# Patient Record
Sex: Female | Born: 1985 | Race: White | Hispanic: Yes | Marital: Single | Smoking: Never smoker
Health system: Southern US, Community
[De-identification: ages and names within clinical notes are randomized; demographics above are authoritative.]

## PROBLEM LIST (undated history)

## (undated) ENCOUNTER — Inpatient Hospital Stay (HOSPITAL_COMMUNITY): Payer: Self-pay

## (undated) DIAGNOSIS — Z789 Other specified health status: Secondary | ICD-10-CM

## (undated) DIAGNOSIS — R87629 Unspecified abnormal cytological findings in specimens from vagina: Secondary | ICD-10-CM

## (undated) DIAGNOSIS — I10 Essential (primary) hypertension: Secondary | ICD-10-CM

## (undated) HISTORY — DX: Unspecified abnormal cytological findings in specimens from vagina: R87.629

## (undated) HISTORY — DX: Essential (primary) hypertension: I10

## (undated) HISTORY — DX: Other specified health status: Z78.9

---
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
## 2004-01-01 ENCOUNTER — Ambulatory Visit: Payer: Self-pay | Admitting: Family Medicine

## 2004-01-01 ENCOUNTER — Ambulatory Visit (HOSPITAL_COMMUNITY): Admission: RE | Admit: 2004-01-01 | Discharge: 2004-01-01 | Payer: Self-pay | Admitting: *Deleted

## 2004-01-16 ENCOUNTER — Ambulatory Visit: Payer: Self-pay | Admitting: Family Medicine

## 2004-01-21 ENCOUNTER — Ambulatory Visit: Payer: Self-pay | Admitting: Obstetrics and Gynecology

## 2004-01-24 ENCOUNTER — Ambulatory Visit (HOSPITAL_COMMUNITY): Admission: RE | Admit: 2004-01-24 | Discharge: 2004-01-24 | Payer: Self-pay | Admitting: *Deleted

## 2004-01-29 ENCOUNTER — Ambulatory Visit (HOSPITAL_COMMUNITY): Admission: RE | Admit: 2004-01-29 | Discharge: 2004-01-29 | Payer: Self-pay | Admitting: *Deleted

## 2004-03-30 ENCOUNTER — Ambulatory Visit: Payer: Self-pay | Admitting: *Deleted

## 2004-04-02 ENCOUNTER — Ambulatory Visit: Payer: Self-pay | Admitting: Family Medicine

## 2004-04-06 ENCOUNTER — Ambulatory Visit: Payer: Self-pay | Admitting: Obstetrics and Gynecology

## 2004-04-09 ENCOUNTER — Ambulatory Visit: Payer: Self-pay | Admitting: Obstetrics and Gynecology

## 2004-04-09 ENCOUNTER — Inpatient Hospital Stay (HOSPITAL_COMMUNITY): Admission: AD | Admit: 2004-04-09 | Discharge: 2004-04-13 | Payer: Self-pay | Admitting: Family Medicine

## 2004-04-11 ENCOUNTER — Encounter (INDEPENDENT_AMBULATORY_CARE_PROVIDER_SITE_OTHER): Payer: Self-pay | Admitting: *Deleted

## 2004-04-16 ENCOUNTER — Inpatient Hospital Stay (HOSPITAL_COMMUNITY): Admission: AD | Admit: 2004-04-16 | Discharge: 2004-04-16 | Payer: Self-pay | Admitting: Obstetrics & Gynecology

## 2004-04-21 ENCOUNTER — Inpatient Hospital Stay (HOSPITAL_COMMUNITY): Admission: AD | Admit: 2004-04-21 | Discharge: 2004-04-21 | Payer: Self-pay | Admitting: *Deleted

## 2004-04-29 ENCOUNTER — Emergency Department (HOSPITAL_COMMUNITY): Admission: EM | Admit: 2004-04-29 | Discharge: 2004-04-29 | Payer: Self-pay | Admitting: Emergency Medicine

## 2004-05-06 ENCOUNTER — Inpatient Hospital Stay (HOSPITAL_COMMUNITY): Admission: AD | Admit: 2004-05-06 | Discharge: 2004-05-06 | Payer: Self-pay | Admitting: Obstetrics and Gynecology

## 2006-09-26 ENCOUNTER — Emergency Department (HOSPITAL_COMMUNITY): Admission: EM | Admit: 2006-09-26 | Discharge: 2006-09-26 | Payer: Self-pay | Admitting: Family Medicine

## 2006-12-28 ENCOUNTER — Emergency Department (HOSPITAL_COMMUNITY): Admission: EM | Admit: 2006-12-28 | Discharge: 2006-12-28 | Payer: Self-pay | Admitting: Family Medicine

## 2007-07-18 ENCOUNTER — Emergency Department (HOSPITAL_COMMUNITY): Admission: EM | Admit: 2007-07-18 | Discharge: 2007-07-18 | Payer: Self-pay | Admitting: Emergency Medicine

## 2007-10-19 ENCOUNTER — Emergency Department (HOSPITAL_COMMUNITY): Admission: EM | Admit: 2007-10-19 | Discharge: 2007-10-19 | Payer: Self-pay | Admitting: Pediatrics

## 2010-06-26 NOTE — Op Note (Signed)
NAMEAIZAH, GEHLHAUSEN                ACCOUNT NO.:  [id number]   MEDICAL RECORD NO.:  [id number]          PATIENT TYPE:  WOC   LOCATION:  WOC                          FACILITY:  [facility]   PHYSICIAN:  Phil D. Okey Dupre, M.D.     DATE OF BIRTH:  July 16, 1985   DATE OF PROCEDURE:  04/11/2004  DATE OF DISCHARGE:                                 OPERATIVE REPORT   PREOPERATIVE DIAGNOSIS:  Post-date baby, unable to tolerate labor.   POSTOPERATIVE DIAGNOSIS:  Post-date baby, unable to tolerate labor.   PROCEDURE:  Low transverse cesarean section.   SURGEON:  Javier Glazier. Okey Dupre, M.D.   ASSISTANTPara March, M.D.   ANESTHESIA:  Spinal.   ESTIMATED BLOOD LOSS:  500 cc.   FINDINGS:  Female infant, 7 pounds 8 ounces, Apgars 9 and 9, cord pH 7.27.   SPECIMENS:  Placenta sent to pathology, hyperspiral cord.   DESCRIPTION OF PROCEDURE:  Under satisfactory spinal anesthesia with the  patient in the dorsal supine position and a Foley catheter in the urinary  bladder, the abdomen was prepped and draped in the usual sterile manner and  entered through a Pfannenstiel suprapubic incision situated 3 cm above the  symphysis pubis and extending for a total length of 16 cm.   The abdomen was entered by layers.  Upon entering the peritoneum cavity, the  visceral peritoneum and anterior surface of the uterus were opened  transversely by sharp dissection.  The bladder was pushed away from the  lower uterine segment which was entered by sharp and blunt dissection.  From  an LOT presentation, the baby was easily delivered.  The cord was doubly  clamped and divided.  The baby was handed to the pediatrician.  Specimen of  blood was taken from the cord for analysis.  The placenta spontaneously  removed.  The uterus was explored and closed with a continuous running  locked 0 Vicryl suture on an atraumatic needle.  The area was observed for  bleeding.  None was noted.  The fascia was closed with a continuous running  0 Vicryl on  an atraumatic needle.  Subcutaneous bleeders were controlled  with hot cautery.  Tape, instrument, sponge, and needle counts were reported  as correct before closure of the skin.  The skin edges were approximated  with skin staples.  A dry sterile dressing was applied.   The patient tolerated the procedure well and was transferred to the recovery  room with a Foley catheter draining clear amber urine at the end of the  procedure.  Total blood loss was approximately 500 cc.  Each layer was  irrigated with normal saline on exit.      PDR/MEDQ  D:  04/11/2004  T:  04/12/2004  Job:  [id number]

## 2010-06-26 NOTE — Discharge Summary (Signed)
NAMEJACIE, TRISTAN                ACCOUNT NO.:  [id number]   MEDICAL RECORD NO.:  [id number]          PATIENT TYPE:  INP   LOCATION:  9130                          [facility]ILITY:  [facility]   PHYSICIAN:  Elinor Dodge, M.D.        DATE OF BIRTH:  01-15-1986   DATE OF ADMISSION:  04/09/2004  DATE OF DISCHARGE:  04/13/2004                                 DISCHARGE SUMMARY   DISCHARGE DIAGNOSIS:  Cesarean delivery of a viable female infant.   PROCEDURES:  Low transverse c-section __________ labor.   DISCHARGE MEDICATIONS:  1.  Ibuprofen 600 mg one tablet p.o. q.6h. p.r.n. pain.  2.  Percocet 5/325 mg one-two tablets p.o. q.6h. p.r.n. minor-to-severe      pain.  3.  Prenatal vitamins one p.o. daily x6 weeks.   DISCHARGE INSTRUCTIONS:  The patient was told to return to the MAU in two-  three days for a staple removal.  Wound care per instructions in booklet.  Diet regular.  Activity, no heavy lifting for three weeks and nothing in the  vagina for six weeks.   FOLLOW UP:  At [facility] six weeks post partum, at which point she will  receive an IUD for contraception.   BRIEF ADMISSION HISTORY:  The patient is a 25 year old G1 who was admitted  at 41-4/7 weeks for induction of labor.  Her prenatal course was complicated  by early symmetric IUGR and a questionable marginal cord insertion.  She was  GBS negative and other prenatal labs were unremarkable.   HOSPITAL COURSE:  At the time of admission the patient was found to have a  cervical exam, fingertip, soft, 50% and -2 station.  She was started on  Cytotec x2 and subsequently Cervidil x1.  Pitocin was begun on hospital day  #2.  __________ occurred on hospital day #3.  Also on that day the baby was  deemed unable to tolerate labor, as the fetal heart rate would slow and  there was decreased variability with contractions.  After obtaining maternal  consent, the patient was taken to the OR for a c-section.   A low transverse c-section resulted  in the delivery of a viable female infant,  birth weight 7 pounds 8 ounces, Apgars 9 at one minute and 9 at five  minutes.  Cord pH was 7.27.  Estimated blood loss was around 500 mL.  This  was all done under spinal anesthesia.  Please see Dr. Okey Dupre dictated surgical  note for details.   The patient had a normal postoperative course with return of bowel function  and stable postoperative hemoglobin of 10.  Routine postoperative care was  given.  The patient decided on an IUD for contraception.  No circumcision  was done on the child.  The patient was discharged home on postoperative day  #2.  At the time of discharge she was afebrile with stable vitals and a  benign exam.      JM/MEDQ  D:  06/09/2004  T:  06/09/2004  Job:  [id number]   cc:   Women's Health  [facility]  Health Department

## 2010-11-05 LAB — POCT RAPID STREP A: Streptococcus, Group A Screen (Direct): NEGATIVE

## 2011-01-12 ENCOUNTER — Other Ambulatory Visit: Payer: Self-pay

## 2011-01-12 DIAGNOSIS — Z331 Pregnant state, incidental: Secondary | ICD-10-CM

## 2011-01-12 NOTE — Progress Notes (Signed)
Prenatal labs done today mholder

## 2011-01-13 LAB — OBSTETRIC PANEL
Antibody Screen: NEGATIVE
Basophils Absolute: 0 10*3/uL (ref 0.0–0.1)
Basophils Relative: 0 % (ref 0–1)
Eosinophils Absolute: 0.1 10*3/uL (ref 0.0–0.7)
Eosinophils Relative: 2 % (ref 0–5)
HCT: 39.1 % (ref 36.0–46.0)
Hemoglobin: 13.3 g/dL (ref 12.0–15.0)
Hepatitis B Surface Ag: NEGATIVE
Lymphocytes Relative: 21 % (ref 12–46)
Lymphs Abs: 1.8 10*3/uL (ref 0.7–4.0)
MCH: 31.1 pg (ref 26.0–34.0)
MCHC: 34 g/dL (ref 30.0–36.0)
MCV: 91.6 fL (ref 78.0–100.0)
Monocytes Absolute: 0.7 10*3/uL (ref 0.1–1.0)
Monocytes Relative: 9 % (ref 3–12)
Neutro Abs: 5.8 10*3/uL (ref 1.7–7.7)
Neutrophils Relative %: 68 % (ref 43–77)
Platelets: 308 10*3/uL (ref 150–400)
RBC: 4.27 MIL/uL (ref 3.87–5.11)
RDW: 13.2 % (ref 11.5–15.5)
Rh Type: POSITIVE
Rubella: >500 IU/mL — ABNORMAL HIGH
WBC: 8.4 10*3/uL (ref 4.0–10.5)

## 2011-01-13 LAB — SICKLE CELL SCREEN: Sickle Cell Screen: NEGATIVE

## 2011-01-13 LAB — HIV ANTIBODY (ROUTINE TESTING W REFLEX): HIV: NONREACTIVE

## 2011-01-14 LAB — CULTURE, OB URINE
Colony Count: NO GROWTH
Organism ID, Bacteria: NO GROWTH

## 2011-01-19 ENCOUNTER — Other Ambulatory Visit (HOSPITAL_COMMUNITY)
Admission: RE | Admit: 2011-01-19 | Discharge: 2011-01-19 | Disposition: A | Discharge status: Home / Self Care | Payer: Self-pay | Source: Ambulatory Visit | Attending: Family Medicine | Admitting: Family Medicine

## 2011-01-19 ENCOUNTER — Ambulatory Visit (INDEPENDENT_AMBULATORY_CARE_PROVIDER_SITE_OTHER): Payer: Self-pay | Admitting: Family Medicine

## 2011-01-19 ENCOUNTER — Encounter: Payer: Self-pay | Admitting: Family Medicine

## 2011-01-19 DIAGNOSIS — Z3480 Encounter for supervision of other normal pregnancy, unspecified trimester: Secondary | ICD-10-CM

## 2011-01-19 DIAGNOSIS — Z331 Pregnant state, incidental: Secondary | ICD-10-CM

## 2011-01-19 DIAGNOSIS — Z3490 Encounter for supervision of normal pregnancy, unspecified, unspecified trimester: Secondary | ICD-10-CM | POA: Insufficient documentation

## 2011-01-19 DIAGNOSIS — Z01419 Encounter for gynecological examination (general) (routine) without abnormal findings: Secondary | ICD-10-CM | POA: Insufficient documentation

## 2011-01-19 NOTE — Patient Instructions (Signed)
It was very nice to meet you today Congratulations on your pregnancy I would like to schedule you for an ultrasound to confirm the dates of your pregnancy, please call Rudell Cobb to arrange to get the orange card as soon as possible.  Please call us when you have this.   I would also like to go ahead and screen you for diabetes since your father has diabetes. Please remind your husband not to smoke within the house as it is bad for everyones health.  Now would be a good time for him to try and quit before baby gets here.  He can call [phone] for the [facility] quitline for help with this.  They have people who speak english and spanish I will see you back in 4 weeks and we will draw your quad screen to screen for genetic disorders, we will also schedule another ultrasound to look at baby's anatomy. Remember if you have bleeding, painful cramping that does not go away, or leaking of fluid please go to [facility] or give our office a call.

## 2011-01-20 LAB — GC/CHLAMYDIA PROBE AMP, GENITAL
Chlamydia, DNA Probe: NEGATIVE
GC Probe Amp, Genital: NEGATIVE

## 2011-01-21 ENCOUNTER — Other Ambulatory Visit: Payer: Self-pay

## 2011-01-21 ENCOUNTER — Other Ambulatory Visit: Payer: Self-pay | Admitting: Family Medicine

## 2011-01-21 LAB — GLUCOSE, CAPILLARY: Glucose-Capillary: 113 mg/dL — ABNORMAL HIGH (ref 70–99)

## 2011-01-21 NOTE — Progress Notes (Signed)
1 HR GLUCOSE = 113 MG/DL BAJORDAN, MLS

## 2011-01-25 ENCOUNTER — Encounter: Payer: Self-pay | Admitting: Family Medicine

## 2011-01-25 NOTE — Progress Notes (Signed)
Subjective:    Rebecca Serrano is a G2P1001 [redacted]w[redacted]d being seen today for her first obstetrical visit.  Pregnancy history fully reviewed.  Unsure of LMP was on OCP, but not taking regularly  Patient reports no bleeding, no contractions, no leaking and mild occasional cramping.  Filed Vitals:   01/19/11 0845  BP: 119/78  Weight: 164 lb (74.39 kg)    HISTORY: OB History    Grav Para Term Preterm Abortions TAB SAB Ect Mult Living   2 1 1  0 0 0 0 0 0 1     # Outc Date GA Lbr Len/2nd Wgt Sex Del Anes PTL Lv   1 TRM 3/06 [redacted]w[redacted]d  7lb8oz(3.402kg) M LTCS Spinal No Yes   2 CUR              Past Medical History  Diagnosis Date  . No pertinent past medical history    Past Surgical History  Procedure Date  . Cesarean section    Family History  Problem Relation Age of Onset  . Diabetes Father   . Drug abuse Paternal Aunt   . Drug abuse Paternal Uncle   . Early death Paternal Grandfather      Exam    Pelvic Exam:    Perineum: Normal Perineum   Vulva: normal   Vagina:  normal mucosa, normal discharge   Cervix: no cervical motion tenderness and no lesions   Adnexa: normal adnexa   Skin: normal coloration and turgor, no rashes    Neurologic: normal   Extremities: normal strength, tone, and muscle mass   HEENT sclera clear, anicteric   Mouth/Teeth mucous membranes moist, pharynx normal without lesions   Neck supple and no masses   Cardiovascular: regular rate and rhythm, no murmurs or gallops   Respiratory:  appears well, vitals normal, no respiratory distress, acyanotic, normal RR, ear and throat exam is normal, neck free of mass or lymphadenopathy, chest clear, no wheezing, crepitations, rhonchi, normal symmetric air entry   Abdomen: soft, non-tender; bowel sounds normal; no masses,  no organomegaly      Assessment:    Pregnancy: G2P1001 Patient Active Problem List  Diagnoses  . Normal pregnancy   PHQ 9 with score of 4      Plan:     Initial labs drawn. Prior  C/S, undecided if she wants to VBAC at this point.   1st degree relative diabetic, early glucola ordered Flu shot given Prenatal vitamins. Problem list reviewed and updated. Genetic Screening discussed Quad Screen: Wants to have done  Ultrasound discussed; fetal survey: Ordered, unsure of dates, will schedule once she has orange card, can not afford [place] with Dr.Marshall.  Follow up in 4 weeks.      Rebecca Serrano 01/25/2011 [facility] Pregnancy Home Risk Screening Form  1. Thinking back to just before you got pregnant how did you feel about becoming pregnant? []   I wanted to be pregnant sooner. []   I wanted to be pregnant now. [x]   I wanted to be pregnant later. []   I did not want to be pregnant then or any time in the future []   I don't know  2.  Within the last year, have you been hit, slapped, kicked or otherwise physically hurt by someone?  No  3.  Are you in a relationship with a person who threatens or physically hurts you? No  4.  Has anyone forced you to have sexual activities that made you feel uncomfortable? No  5.  In the last  12 months were you ever hungry but didn't east because you couldn't afford enough food?  No  6.  Do you have a safe and stable place to live?  Yes  7.  Which statement best describes your smoking status?   [x]   I have never smoked, or have smoked less than 100 cigarettes in my lifetime []   I stopped smoking BEFORE I found out I was pregnant and am not smoking now []   I stopped smoking AFTER I found out I was pregnant and am not smoking now []   I smoke now but have cut down some since I found out I was pregnant []   I smoke about the same amount now as I did before I found out I was pregnant  8.  Did any of your parents have a problem with alcohol or other drug use? No  9.  Do any of your friends have a problem with alcohol or other drug use? No  10.  Does your partner have a problem with alcohol or other drug use? Yes, cigarettes  11.  In the  past, have you had difficulties in your life due to alcohol or other drugs, including prescription medications? No  12.  Before you knew you were pregnant, how often did you drink any alcohol, including beer or wine, or use other drugs?  [x]   Not at all []   Rarely []   Sometimes []   Frequently  13.  In the past month, how often did you drink any alcohol, including beer or wine, or use another drug? [x]   Not at all []   Rarely []   Sometimes []   Frequently

## 2011-01-26 ENCOUNTER — Telehealth: Payer: Self-pay | Admitting: Family Medicine

## 2011-01-26 NOTE — Telephone Encounter (Signed)
Patient brought by her orange card and I scanned it into her chart.  She would like for you to call her with an ultrasound appointment.

## 2011-01-27 ENCOUNTER — Telehealth: Payer: Self-pay | Admitting: Family Medicine

## 2011-01-27 ENCOUNTER — Encounter: Payer: Self-pay | Admitting: Family Medicine

## 2011-01-27 ENCOUNTER — Other Ambulatory Visit: Payer: Self-pay | Admitting: Family Medicine

## 2011-01-27 NOTE — Telephone Encounter (Signed)
done

## 2011-01-27 NOTE — Telephone Encounter (Signed)
appt is sched for 12/27 at 9:15 AM. Note is sent to pt as I have been unable to reach pt by phone, numbers incorrect,etc.Rebecca Serrano [name]

## 2011-01-28 ENCOUNTER — Encounter: Payer: Self-pay | Admitting: Family Medicine

## 2011-01-28 NOTE — Progress Notes (Signed)
Note reviewed.  Agree with plan of care by Dr. Ashley Royalty. Unsure dates- needs ultrasound asap.  Hopefully will qualify for orange card soon. +FH diabetes - check early glucola and repeat at 26-28 weeks. H/o section - will need consult with OB at 30-32 weeks to discuss delivery planning.

## 2011-02-04 ENCOUNTER — Ambulatory Visit (HOSPITAL_COMMUNITY)
Admission: RE | Admit: 2011-02-04 | Discharge: 2011-02-04 | Disposition: A | Discharge status: Home / Self Care | Payer: Self-pay | Source: Ambulatory Visit | Attending: Family Medicine | Admitting: Family Medicine

## 2011-02-04 DIAGNOSIS — Z3689 Encounter for other specified antenatal screening: Secondary | ICD-10-CM | POA: Insufficient documentation

## 2011-02-09 NOTE — L&D Delivery Note (Addendum)
Delivery Note At  a viable female was delivered via NSVD  (Presentation: OA ; at 1713 ).  APGAR: 8,9 ; weight 8lbs .   Placenta status: Delivered intact at  1724, .  Cord: 3 vessel  with the following complications: none .  Cord pH: n/a  Anesthesia:  Epidural Episiotomy: n/a Lacerations: 1st degree R labial  Suture Repair: 3.0 vicryl Est. Blood Loss (mL): 400 mL  Mom to postpartum.  Baby to nursery-stable.  Jasim Harari 08/10/2011, 5:43 PM

## 2011-02-17 ENCOUNTER — Ambulatory Visit (INDEPENDENT_AMBULATORY_CARE_PROVIDER_SITE_OTHER): Payer: Self-pay | Admitting: Family Medicine

## 2011-02-17 ENCOUNTER — Encounter: Payer: Self-pay | Admitting: Family Medicine

## 2011-02-17 DIAGNOSIS — Z331 Pregnant state, incidental: Secondary | ICD-10-CM

## 2011-02-17 NOTE — Patient Instructions (Addendum)
It was good seeing you today.  We will schedule an [place] for you in the next 4 weeks I would like for you to make an appt. With our [facility] clinic here for your next visit in 4 weeks You may start to feel baby move in the next few weeks Remember if you have bleeding, cramping, leaking of fluid go to [facility] or call our clinic

## 2011-02-17 NOTE — Progress Notes (Signed)
Here for f/u OB visit.  Had early Korea due to being unsure of LMP. [place] dates consistent with LMP, giving her GA of [redacted]w[redacted]d No concerns today.  Denies cramping, bleeding, leaking of fluid.  No nausea. Early glucola 113 Hx of prior C/S FHR as noted in vitals, BP ok.  Assessment and Plan: Normal pregnancy  -Anatomy US ordered  -Quad screen drawn today  -Cont PNV  -Recheck glucola around 24 weeks  -Schedule next OB appt with OB clinic  -[facility] VB[facility] around 34 weeks

## 2011-03-11 ENCOUNTER — Ambulatory Visit (INDEPENDENT_AMBULATORY_CARE_PROVIDER_SITE_OTHER): Payer: Self-pay | Admitting: Family Medicine

## 2011-03-11 DIAGNOSIS — Z3480 Encounter for supervision of other normal pregnancy, unspecified trimester: Secondary | ICD-10-CM

## 2011-03-11 NOTE — Progress Notes (Signed)
25yo G2P1 @ 19 wks w/o concerns today. Would like to start exercising. Takes prenatal vitamin 4/7 days. No fetal movement yet, but pt denied feeling any w/ prior pregnancy. Denies vaginal discharge/pain, contractions, RUQ pain, change in vision. H/o c-section but would like to try VBAC. Pt scheduled for anatomy scan next week.   A/P 25yo G2P1 at 19wks and doing well. Pt to continue w/ daily prenatal vitamin. Kick counts and fetal movement reviewed w/ pt. Pt to return in 4 wks. Will refer pt to OB for delivery plan at approximately 30wks. Handouts provided on Kick count, second trimester pregnancy, and exercise during pregnancy. She received flu vaccine today.

## 2011-03-11 NOTE — Patient Instructions (Addendum)
Thank you for coming in to clinic today. Your pregnancy is going very well. As the pregnancy continues you should start to feel the baby moving. Please continue to take your prenatal vitamin every day. I've included some information below on exercise during pregnancy. Exercise is great for you and the baby so long as you stay well hydrated, avoid high impact, and start w/ short distances and go slow. Please come back to clinic in 4 weeks.     Fetal Movement Counts Patient Name: __________________________________________________ Patient Due Date: ____________________ Rebecca Serrano counts is highly recommended in high risk pregnancies, but it is a good idea for every pregnant woman to do. Start counting fetal movements at 28 weeks of the pregnancy. Fetal movements increase after eating a full meal or eating or drinking something sweet (the blood sugar is higher). It is also important to drink plenty of fluids (well hydrated) before doing the count. Lie on your left side because it helps with the circulation or you can sit in a comfortable chair with your arms over your belly (abdomen) with no distractions around you. DOING THE COUNT  Try to do the count the same time of day each time you do it.     Mark the day and time, then see how long it takes for you to feel 10 movements (kicks, flutters, swishes, rolls). You should have at least 10 movements within 2 hours. You will most likely feel 10 movements in much less than 2 hours. If you do not, wait an hour and count again. After a couple of days you will see a pattern.     What you are looking for is a change in the pattern or not enough counts in 2 hours. Is it taking longer in time to reach 10 movements?  SEEK MEDICAL CARE IF:  You feel less than 10 counts in 2 hours. Tried twice.     No movement in one hour.     The pattern is changing or taking longer each day to reach 10 counts in 2 hours.     You feel the baby is not moving as it usually does.    Date: ____________ Movements: ____________ Start time: ____________ Rebecca Serrano time: ____________   Date: ____________ Movements: ____________ Start time: ____________ Rebecca Serrano time: ____________ Date: ____________ Movements: ____________ Start time: ____________ Rebecca Serrano time: ____________ Date: ____________ Movements: ____________ Start time: ____________ Rebecca Serrano time: ____________ Date: ____________ Movements: ____________ Start time: ____________ Rebecca Serrano time: ____________ Date: ____________ Movements: ____________ Start time: ____________ Rebecca Serrano time: ____________ Date: ____________ Movements: ____________ Start time: ____________ Rebecca Serrano time: ____________ Date: ____________ Movements: ____________ Start time: ____________ Rebecca Serrano time: ____________   Date: ____________ Movements: ____________ Start time: ____________ Rebecca Serrano time: ____________ Date: ____________ Movements: ____________ Start time: ____________ Rebecca Serrano time: ____________ Date: ____________ Movements: ____________ Start time: ____________ Rebecca Serrano time: ____________ Date: ____________ Movements: ____________ Start time: ____________ Rebecca Serrano time: ____________ Date: ____________ Movements: ____________ Start time: ____________ Rebecca Serrano time: ____________ Date: ____________ Movements: ____________ Start time: ____________ Rebecca Serrano time: ____________ Date: ____________ Movements: ____________ Start time: ____________ Rebecca Serrano time: ____________   Date: ____________ Movements: ____________ Start time: ____________ Rebecca Serrano time: ____________ Date: ____________ Movements: ____________ Start time: ____________ Rebecca Serrano time: ____________ Date: ____________ Movements: ____________ Start time: ____________ Rebecca Serrano time: ____________ Date: ____________ Movements: ____________ Start time: ____________ Rebecca Serrano time: ____________ Date: ____________ Movements: ____________ Start time: ____________ Rebecca Serrano time: ____________ Date: ____________ Movements:  ____________ Start time: ____________ Rebecca Serrano time: ____________ Date: ____________ Movements: ____________ Start time: ____________ Rebecca Serrano time: ____________  Date: ____________ Movements: ____________ Start time: ____________ Rebecca Serrano time: ____________ Date: ____________ Movements: ____________ Start time: ____________ Rebecca Serrano time: ____________ Date: ____________ Movements: ____________ Start time: ____________ Rebecca Serrano time: ____________ Date: ____________ Movements: ____________ Start time: ____________ Rebecca Serrano time: ____________ Date: ____________ Movements: ____________ Start time: ____________ Rebecca Serrano time: ____________ Date: ____________ Movements: ____________ Start time: ____________ Rebecca Serrano time: ____________ Date: ____________ Movements: ____________ Start time: ____________ Rebecca Serrano time: ____________   Date: ____________ Movements: ____________ Start time: ____________ Rebecca Serrano time: ____________ Date: ____________ Movements: ____________ Start time: ____________ Rebecca Serrano time: ____________ Date: ____________ Movements: ____________ Start time: ____________ Rebecca Serrano time: ____________ Date: ____________ Movements: ____________ Start time: ____________ Rebecca Serrano time: ____________ Date: ____________ Movements: ____________ Start time: ____________ Rebecca Serrano time: ____________ Date: ____________ Movements: ____________ Start time: ____________ Rebecca Serrano time: ____________ Date: ____________ Movements: ____________ Start time: ____________ Rebecca Serrano time: ____________   Date: ____________ Movements: ____________ Start time: ____________ Rebecca Serrano time: ____________ Date: ____________ Movements: ____________ Start time: ____________ Rebecca Serrano time: ____________ Date: ____________ Movements: ____________ Start time: ____________ Rebecca Serrano time: ____________ Date: ____________ Movements: ____________ Start time: ____________ Rebecca Serrano time: ____________ Date: ____________ Movements: ____________ Start time: ____________  Rebecca Serrano time: ____________ Date: ____________ Movements: ____________ Start time: ____________ Rebecca Serrano time: ____________ Date: ____________ Movements: ____________ Start time: ____________ Rebecca Serrano time: ____________   Date: ____________ Movements: ____________ Start time: ____________ Rebecca Serrano time: ____________ Date: ____________ Movements: ____________ Start time: ____________ Rebecca Serrano time: ____________ Date: ____________ Movements: ____________ Start time: ____________ Rebecca Serrano time: ____________ Date: ____________ Movements: ____________ Start time: ____________ Rebecca Serrano time: ____________ Date: ____________ Movements: ____________ Start time: ____________ Rebecca Serrano time: ____________ Date: ____________ Movements: ____________ Start time: ____________ Rebecca Serrano time: ____________ Date: ____________ Movements: ____________ Start time: ____________ Rebecca Serrano time: ____________   Date: ____________ Movements: ____________ Start time: ____________ Rebecca Serrano time: ____________ Date: ____________ Movements: ____________ Start time: ____________ Rebecca Serrano time: ____________ Date: ____________ Movements: ____________ Start time: ____________ Rebecca Serrano time: ____________ Date: ____________ Movements: ____________ Start time: ____________ Rebecca Serrano time: ____________ Date: ____________ Movements: ____________ Start time: ____________ Rebecca Serrano time: ____________ Date: ____________ Movements: ____________ Start time: ____________ Rebecca Serrano time: ____________ Document Released: 02/24/2006 Document Revised: 10/07/2010 Document Reviewed: 08/27/2008 [employer] Patient Information 2012 [employer]   Pregnancy - Second Trimester The second trimester of pregnancy (3 to 6 months) is a period of rapid growth for you and your baby. At the end of the sixth month, your baby is about 9 inches long and weighs 1 1/2 pounds. You will begin to feel the baby move between 18 and 20 weeks of the pregnancy. This is called quickening. Weight gain is  faster. A clear fluid (colostrum) may leak out of your breasts. You may feel small contractions of the womb (uterus). This is known as false labor or Braxton-Hicks contractions. This is like a practice for labor when the baby is ready to be born. Usually, the problems with morning sickness have usually passed by the end of your first trimester. Some women develop small dark blotches (called cholasma, mask of pregnancy) on their face that usually goes away after the baby is born. Exposure to the sun makes the blotches worse. Acne may also develop in some pregnant women and pregnant women who have acne, may find that it goes away. PRENATAL EXAMS  Blood work may continue to be done during prenatal exams. These tests are done to check on your health and the probable health of your baby. Blood work is used to follow your blood levels (hemoglobin). Anemia (low hemoglobin) is common during pregnancy. Iron and vitamins are given to help prevent this. You  will also be checked for diabetes between 24 and 28 weeks of the pregnancy. Some of the previous blood tests may be repeated.     The size of the uterus is measured during each visit. This is to make sure that the baby is continuing to grow properly according to the dates of the pregnancy.     Your blood pressure is checked every prenatal visit. This is to make sure you are not getting toxemia.     Your urine is checked to make sure you do not have an infection, diabetes or protein in the urine.     Your weight is checked often to make sure gains are happening at the suggested rate. This is to ensure that both you and your baby are growing normally.     Sometimes, an ultrasound is performed to confirm the proper growth and development of the baby. This is a test which bounces harmless sound waves off the baby so your caregiver can more accurately determine due dates.  Sometimes, a specialized test is done on the amniotic fluid surrounding the baby. This test  is called an amniocentesis. The amniotic fluid is obtained by sticking a needle into the belly (abdomen). This is done to check the chromosomes in instances where there is a concern about possible genetic problems with the baby. It is also sometimes done near the end of pregnancy if an early delivery is required. In this case, it is done to help make sure the baby's lungs are mature enough for the baby to live outside of the womb. CHANGES OCCURING IN THE SECOND TRIMESTER OF PREGNANCY Your body goes through many changes during pregnancy. They vary from person to person. Talk to your caregiver about changes you notice that you are concerned about.  During the second trimester, you will likely have an increase in your appetite. It is normal to have cravings for certain foods. This varies from person to person and pregnancy to pregnancy.     Your lower abdomen will begin to bulge.     You may have to urinate more often because the uterus and baby are pressing on your bladder. It is also common to get more bladder infections during pregnancy (pain with urination). You can help this by drinking lots of fluids and emptying your bladder before and after intercourse.     You may begin to get stretch marks on your hips, abdomen, and breasts. These are normal changes in the body during pregnancy. There are no exercises or medications to take that prevent this change.     You may begin to develop swollen and bulging veins (varicose veins) in your legs. Wearing support hose, elevating your feet for 15 minutes, 3 to 4 times a day and limiting salt in your diet helps lessen the problem.     Heartburn may develop as the uterus grows and pushes up against the stomach. Antacids recommended by your caregiver helps with this problem. Also, eating smaller meals 4 to 5 times a day helps.     Constipation can be treated with a stool softener or adding bulk to your diet. Drinking lots of fluids, vegetables, fruits, and  whole grains are helpful.     Exercising is also helpful. If you have been very active up until your pregnancy, most of these activities can be continued during your pregnancy. If you have been less active, it is helpful to start an exercise program such as walking.     Hemorrhoids (varicose  veins in the rectum) may develop at the end of the second trimester. Warm sitz baths and hemorrhoid cream recommended by your caregiver helps hemorrhoid problems.     Backaches may develop during this time of your pregnancy. Avoid heavy lifting, wear low heal shoes and practice good posture to help with backache problems.     Some pregnant women develop tingling and numbness of their hand and fingers because of swelling and tightening of ligaments in the wrist (carpel tunnel syndrome). This goes away after the baby is born.     As your breasts enlarge, you may have to get a bigger bra. Get a comfortable, cotton, support bra. Do not get a nursing bra until the last month of the pregnancy if you will be nursing the baby.     You may get a dark line from your belly button to the pubic area called the linea nigra.     You may develop rosy cheeks because of increase blood flow to the face.     You may develop spider looking lines of the face, neck, arms and chest. These go away after the baby is born.  HOME CARE INSTRUCTIONS    It is extremely important to avoid all smoking, herbs, alcohol, and unprescribed drugs during your pregnancy. These chemicals affect the formation and growth of the baby. Avoid these chemicals throughout the pregnancy to ensure the delivery of a healthy infant.     Most of your home care instructions are the same as suggested for the first trimester of your pregnancy. Keep your caregiver's appointments. Follow your caregiver's instructions regarding medication use, exercise and diet.     During pregnancy, you are providing food for you and your baby. Continue to eat regular,  well-balanced meals. Choose foods such as meat, fish, milk and other low fat dairy products, vegetables, fruits, and whole-grain breads and cereals. Your caregiver will tell you of the ideal weight gain.     A physical sexual relationship may be continued up until near the end of pregnancy if there are no other problems. Problems could include early (premature) leaking of amniotic fluid from the membranes, vaginal bleeding, abdominal pain, or other medical or pregnancy problems.     Exercise regularly if there are no restrictions. Check with your caregiver if you are unsure of the safety of some of your exercises. The greatest weight gain will occur in the last 2 trimesters of pregnancy. Exercise will help you:     Control your weight.     Get you in shape for labor and delivery.     Lose weight after you have the baby.     Wear a good support or jogging bra for breast tenderness during pregnancy. This may help if worn during sleep. Pads or tissues may be used in the bra if you are leaking colostrum.     Do not use hot tubs, steam rooms or saunas throughout the pregnancy.     Wear your seat belt at all times when driving. This protects you and your baby if you are in an accident.     Avoid raw meat, uncooked cheese, cat litter boxes and soil used by cats. These carry germs that can cause birth defects in the baby.     The second trimester is also a good time to visit your dentist for your dental health if this has not been done yet. Getting your teeth cleaned is OK. Use a soft toothbrush. Brush gently during pregnancy.  It is easier to loose urine during pregnancy. Tightening up and strengthening the pelvic muscles will help with this problem. Practice stopping your urination while you are going to the bathroom. These are the same muscles you need to strengthen. It is also the muscles you would use as if you were trying to stop from passing gas. You can practice tightening these muscles up  10 times a set and repeating this about 3 times per day. Once you know what muscles to tighten up, do not perform these exercises during urination. It is more likely to contribute to an infection by backing up the urine.     Ask for help if you have financial, counseling or nutritional needs during pregnancy. Your caregiver will be able to offer counseling for these needs as well as refer you for other special needs.     Your skin may become oily. If so, wash your face with mild soap, use non-greasy moisturizer and oil or cream based makeup.  MEDICATIONS AND DRUG USE IN PREGNANCY  Take prenatal vitamins as directed. The vitamin should contain 1 milligram of folic acid. Keep all vitamins out of reach of children. Only a couple vitamins or tablets containing iron may be fatal to a baby or young child when ingested.     Avoid use of all medications, including herbs, over-the-counter medications, not prescribed or suggested by your caregiver. Only take over-the-counter or prescription medicines for pain, discomfort, or fever as directed by your caregiver. Do not use aspirin.     Let your caregiver also know about herbs you may be using.     Alcohol is related to a number of birth defects. This includes fetal alcohol syndrome. All alcohol, in any form, should be avoided completely. Smoking will cause low birth rate and premature babies.     Street or illegal drugs are very harmful to the baby. They are absolutely forbidden. A baby born to an addicted mother will be addicted at birth. The baby will go through the same withdrawal an adult does.  SEEK MEDICAL CARE IF:   You have any concerns or worries during your pregnancy. It is better to call with your questions if you feel they cannot wait, rather than worry about them. SEEK IMMEDIATE MEDICAL CARE IF:    An unexplained oral temperature above 102 F (38.9 C) develops, or as your caregiver suggests.     You have leaking of fluid from the vagina  (birth canal). If leaking membranes are suspected, take your temperature and tell your caregiver of this when you call.     There is vaginal spotting, bleeding, or passing clots. Tell your caregiver of the amount and how many pads are used. Light spotting in pregnancy is common, especially following intercourse.     You develop a bad smelling vaginal discharge with a change in the color from clear to white.     You continue to feel sick to your stomach (nauseated) and have no relief from remedies suggested. You vomit blood or coffee ground-like materials.     You lose more than 2 pounds of weight or gain more than 2 pounds of weight over 1 week, or as suggested by your caregiver.     You notice swelling of your face, hands, feet, or legs.     You get exposed to [place] measles and have never had them.     You are exposed to fifth disease or chickenpox.     You develop belly (abdominal)  pain. Round ligament discomfort is a common non-cancerous (benign) cause of abdominal pain in pregnancy. Your caregiver still must evaluate you.     You develop a bad headache that does not go away.     You develop fever, diarrhea, pain with urination, or shortness of breath.     You develop visual problems, blurry, or double vision.     You fall or are in a car accident or any kind of trauma.     There is mental or physical violence at home.  Document Released: 01/19/2001 Document Revised: 10/07/2010 Document Reviewed: 07/24/2008 [facility] Patient Information 2012 [place], [place].   Exercise During Pregnancy It is possible to maintain a healthy exercise program throughout a pregnancy. However, it is important to discuss exercising with your caregiver, so that the two of you may develop an appropriate exercise program. It is important to remember that exercise during pregnancy should be use to maintain one's health and not to lose weight. Strenuous activities should be avoided as the may cause the baby to  have difficulty obtaining proper amounts of oxygen. A proper pregnancy exercise plan has many benefits including preparing you for the physical challenges of childbirth by strengthening the muscles that help with childbirth, reducing common backaches, alleviating constipation, improving posture, elevating mood, and promoting better sleep. If possible, begin exercising regularly before you become pregnant and continue through the duration of the pregnancy. It is difficult for a woman to begin an exercise program later in a pregnancy due to enlargement of the uterus and breasts and a shift in the center of gravity. Pregnancy exercise programs should be aimed at improving the muscles of the heart, back, pelvis, and abdomen. GENERAL GUIDELINES   Every woman and pregnancy is different; thus, the level of exercise you can do depends on your health, the conditions of the pregnancy, and activity level before pregnancy. For women who were sedentary before pregnancy, walking is a good way to begin. Use caution while participation in sports during pregnancy, because your center of gravity changes and may affect your balance or that require rapid movements. Always make sure to drink plenty of fluids to avoid dehydration, which may decrease blood flow to your baby. Avoid any activity that has the potential for trauma to the abdomen. If possible try to avoid high-altitude activities, which can deprive you and your baby of oxygen; this may cause premature labor. Talk with your caregiver.   Performing a proper warm up and cool down are very important. It is important to start slowly and build up to more demanding exercises. Toward the end of an exercise session, gradually slow your activity. Perhaps try and work back through the exercises in reverse order. Check your pulse during peak activity, and discuss with your caregiver an appropriate range of heart rate for activity. Slow down your activity if your heart starts beating  faster than the target range recommended by your health care provider. Do not exceed a heart rate of 140 beats per minute. Exercise that is too strenuous may speed up the baby's heartbeat to a dangerous level. In general, if you are able to carry on a conversation comfortably while exercising, your heart rate is probably within the recommended limits. Check to make sure.   You should stop exercising and call your health care provider if you have any unusual symptoms, such as pain, uterine contractions, chest pain, bleeding or fluid leakage from the vagina, dizziness, or shortness of breath. Talk to your health caregiver if  you have any questions.   Document Released: 01/25/2005 Document Revised: 10/07/2010 Document Reviewed: 05/09/2008 [facility] Patient Information 2012 [place], [place].

## 2011-03-17 ENCOUNTER — Ambulatory Visit (HOSPITAL_COMMUNITY)
Admission: RE | Admit: 2011-03-17 | Discharge: 2011-03-17 | Disposition: A | Discharge status: Home / Self Care | Payer: Self-pay | Source: Ambulatory Visit | Attending: Family Medicine | Admitting: Family Medicine

## 2011-03-17 DIAGNOSIS — O358XX0 Maternal care for other (suspected) fetal abnormality and damage, not applicable or unspecified: Secondary | ICD-10-CM | POA: Insufficient documentation

## 2011-03-17 DIAGNOSIS — Z1389 Encounter for screening for other disorder: Secondary | ICD-10-CM | POA: Insufficient documentation

## 2011-03-17 DIAGNOSIS — Z363 Encounter for antenatal screening for malformations: Secondary | ICD-10-CM | POA: Insufficient documentation

## 2011-03-17 DIAGNOSIS — O34219 Maternal care for unspecified type scar from previous cesarean delivery: Secondary | ICD-10-CM | POA: Insufficient documentation

## 2011-04-09 ENCOUNTER — Ambulatory Visit (INDEPENDENT_AMBULATORY_CARE_PROVIDER_SITE_OTHER): Payer: Self-pay | Admitting: Family Medicine

## 2011-04-09 VITALS — BP 102/62 | Wt 174.8 lb

## 2011-04-09 DIAGNOSIS — Z3490 Encounter for supervision of normal pregnancy, unspecified, unspecified trimester: Secondary | ICD-10-CM

## 2011-04-09 DIAGNOSIS — Z3480 Encounter for supervision of other normal pregnancy, unspecified trimester: Secondary | ICD-10-CM

## 2011-04-09 NOTE — Patient Instructions (Signed)
Thank you for coming in today, it was good to see you I would like for you to come in for a lab visit next week to have your glucola (test for diabetes) done.   I will see you back in 4 weeks, we will check some additional labwork at that time. Now that you are feeling baby move well, try to do kick counts when you are resting.  Please call with any questions, and remember if you have bleeding, leaking of fluid, cramping/contractions please go to [facility]

## 2011-04-14 ENCOUNTER — Other Ambulatory Visit (INDEPENDENT_AMBULATORY_CARE_PROVIDER_SITE_OTHER): Payer: Self-pay | Admitting: Family Medicine

## 2011-04-14 ENCOUNTER — Other Ambulatory Visit: Payer: Self-pay

## 2011-04-14 DIAGNOSIS — Z3480 Encounter for supervision of other normal pregnancy, unspecified trimester: Secondary | ICD-10-CM

## 2011-04-14 DIAGNOSIS — Z331 Pregnant state, incidental: Secondary | ICD-10-CM

## 2011-04-14 LAB — GLUCOSE, POCT (MANUAL RESULT ENTRY): POC Glucose: 132

## 2011-04-14 NOTE — Progress Notes (Signed)
1 Hr OB glucose = 132 mg/dL BAJORDAN, MLS

## 2011-04-14 NOTE — Progress Notes (Signed)
1 hr gtt done today Rebecca Serrano 

## 2011-04-20 NOTE — Progress Notes (Signed)
Here for f/u OB visit.  Had early [place] due to being unsure of LMP. [place] dates consistent with LMP, giving her GA of [redacted]w[redacted]d No concerns today.  Denies cramping, bleeding, leaking of fluid.  No nausea. Early glucola 113 Hx of prior C/S, would like to VBAC FHR as noted in vitals, BP ok.  Anatomy US: Female, no abnormalities seen  Assessment and Plan: Normal pregnancy  -Cont PNV  -Recheck glucola  -[facility] around 30 weeks  -Kick count reviewed  -Return in 4 weeks

## 2011-05-05 ENCOUNTER — Encounter: Payer: Self-pay | Admitting: Family Medicine

## 2011-05-06 ENCOUNTER — Ambulatory Visit (INDEPENDENT_AMBULATORY_CARE_PROVIDER_SITE_OTHER): Payer: Self-pay | Admitting: Family Medicine

## 2011-05-06 VITALS — BP 104/72 | Temp 98.0°F | Wt 182.6 lb

## 2011-05-06 DIAGNOSIS — Z3490 Encounter for supervision of normal pregnancy, unspecified, unspecified trimester: Secondary | ICD-10-CM

## 2011-05-06 DIAGNOSIS — Z3480 Encounter for supervision of other normal pregnancy, unspecified trimester: Secondary | ICD-10-CM

## 2011-05-06 LAB — CBC
HCT: 37.8 % (ref 36.0–46.0)
Hemoglobin: 12.1 g/dL (ref 12.0–15.0)
MCH: 30.6 pg (ref 26.0–34.0)
MCHC: 32 g/dL (ref 30.0–36.0)
MCV: 95.7 fL (ref 78.0–100.0)
Platelets: 296 10*3/uL (ref 150–400)
RBC: 3.95 MIL/uL (ref 3.87–5.11)
RDW: 13.8 % (ref 11.5–15.5)
WBC: 12 10*3/uL — ABNORMAL HIGH (ref 4.0–10.5)

## 2011-05-06 LAB — HIV ANTIBODY (ROUTINE TESTING W REFLEX): HIV: NONREACTIVE

## 2011-05-06 LAB — RPR

## 2011-05-06 NOTE — Patient Instructions (Addendum)
Thank you for coming in today, it was good to see you For the area between your breasts you can use a medicine called clotrimazole, you can purchase this over the counter. I would like for you to schedule an return appointment for two weeks from now with our OB clinic. Remember to do kick counts when you can. If you have bleeding, leaking of fluid, or contractions go to [facility].

## 2011-05-17 NOTE — Progress Notes (Signed)
Here for f/u OB visit.  Had early [place] due to being unsure of LMP. [place] dates consistent with LMP, giving her GA of 27.0w C/o itchy area between breasts. Denies cramping, bleeding, leaking of fluid.  No nausea. Early glucola 113, repeat 132 Hx of prior C/S, would like to VBAC FHR as noted in vitals, BP ok.  Anatomy US: Female, no abnormalities seen  O:  See flowsheet Area of hyperpigmentation between breasts, no erythema or scaling Assessment and Plan: Normal pregnancy  -Cont PNV  -?Tinea versicolor between breasts, trial of clotrimazole.   -Check CBC, RPR, HIV  -[facility]AC around 30 weeks  -Kick count reviewed  -F/u 2 weeks.  -Return in 2 weeks

## 2011-05-18 ENCOUNTER — Encounter (HOSPITAL_COMMUNITY): Payer: Self-pay | Admitting: *Deleted

## 2011-05-18 ENCOUNTER — Inpatient Hospital Stay (HOSPITAL_COMMUNITY)
Admission: AD | Admit: 2011-05-18 | Discharge: 2011-05-18 | Disposition: A | Discharge status: Home / Self Care | Payer: Self-pay | Source: Ambulatory Visit | Attending: Obstetrics & Gynecology | Admitting: Obstetrics & Gynecology

## 2011-05-18 ENCOUNTER — Telehealth: Payer: Self-pay | Admitting: Family Medicine

## 2011-05-18 DIAGNOSIS — R109 Unspecified abdominal pain: Secondary | ICD-10-CM | POA: Insufficient documentation

## 2011-05-18 DIAGNOSIS — O368190 Decreased fetal movements, unspecified trimester, not applicable or unspecified: Secondary | ICD-10-CM | POA: Insufficient documentation

## 2011-05-18 DIAGNOSIS — N949 Unspecified condition associated with female genital organs and menstrual cycle: Secondary | ICD-10-CM

## 2011-05-18 NOTE — MAU Provider Note (Signed)
Chief Complaint:  Decreased Fetal Movement and Abdominal Cramping    None     Rebecca Serrano is  26 y.o. G2P1001.  Patient's last menstrual period was 10/29/2010..[redacted]w[redacted]d  She presents complaining of Decreased Fetal Movement and Abdominal Cramping . Onset is described as ongoing and has been present for  3 days. Reports no FM since Sunday and intermittent sharp left-sided groin pain with movement. Denies bleeding, LOF, cramping, contractions, dysuria, or back pain.  Obstetrical/Gynecological History: OB History    Grav Para Term Preterm Abortions TAB SAB Ect Mult Living   2 1 1  0 0 0 0 0 0 1      Past Medical History: Past Medical History  Diagnosis Date  . No pertinent past medical history     Past Surgical History: Past Surgical History  Procedure Date  . Cesarean section     Family History: Family History  Problem Relation Age of Onset  . Diabetes Father   . Drug abuse Paternal Aunt   . Drug abuse Paternal Uncle   . Early death Paternal Grandfather     Social History: History  Substance Use Topics  . Smoking status: Never Smoker   . Smokeless tobacco: Never Used  . Alcohol Use: No    Allergies: No Known Allergies  Prescriptions prior to admission  Medication Sig Dispense Refill  . Prenatal Vit-Fe Sulfate-FA (PRENATAL VITAMIN PO) Take 1 tablet by mouth daily.          Review of Systems - Negative except what has been reviewed in the HPI  Physical Exam   Blood pressure 107/59, pulse 84, temperature 98.3 F (36.8 C), temperature source Oral, resp. rate 16, height 5\' 2"  (1.575 m), weight 186 lb (84.369 kg), last menstrual period 10/29/2010.  General: General appearance - alert, well appearing, and in no distress and oriented to person, place, and time Mental status - alert, oriented to person, place, and time, normal mood, behavior, speech, dress, motor activity, and thought processes, affect appropriate to mood Abdomen - soft, nontender, nondistended, no  masses or organomegaly Focused Gynecological Exam: closed/long/thick/post FHT: 135 mod variabilty, + 15x15 accels, no decels Toco: no ctx  ED Course: Subjectively NL fluid on bedside US. Single pocket measurement 5cm.   Assessment: Round Ligament Pain Decreased Fetal Movement  Plan: Discharge home Kick Counts Comfort measures reviewed FU with Dr. Ashley Royalty as scheduled  [facility] E. 05/18/2011,5:22 PM

## 2011-05-18 NOTE — MAU Note (Signed)
Pt states, " I haven't felt my baby move since Sunday and I have a bad cramp when I move like to get up, and I have a lot of pressure."

## 2011-05-18 NOTE — Telephone Encounter (Signed)
Pt is 7 mo preg and hasn't felt baby move since Sunday.  Wants to know what to do.

## 2011-05-18 NOTE — Telephone Encounter (Signed)
Patient advised to go to [facility] now for evaluation. She voices understanding.

## 2011-05-18 NOTE — Discharge Instructions (Signed)
Round Ligament Pain The round ligament is made up of muscle and fibrous tissue. It is attached to the uterus near the fallopian tube. The round ligament is located on both sides of the uterus and helps support the position of the uterus. It usually begins in the second trimester of pregnancy when the uterus comes out of the pelvis. The pain can come and go until the baby is delivered. Round ligament pain is not a serious problem and does not cause harm to the baby. CAUSE During pregnancy the uterus grows the most from the second trimester to delivery. As it grows, it stretches and slightly twists the round ligaments. When the uterus leans from one side to the other, the round ligament on the opposite side pulls and stretches. This can cause pain. SYMPTOMS  Pain can occur on one side or both sides. The pain is usually a short, sharp, and pinching-like. Sometimes it can be a dull, lingering and aching pain. The pain is located in the lower side of the abdomen or in the groin. The pain is internal and usually starts deep in the groin and moves up to the outside of the hip area. Pain can occur with:  Sudden change in position like getting out of bed or a chair.   Rolling over in bed.   Coughing or sneezing.   Walking too much.   Any type of physical activity.  DIAGNOSIS  Your caregiver will make sure there are no serious problems causing the pain. When nothing serious is found, the symptoms usually indicate that the pain is from the round ligament. TREATMENT   Sit down and relax when the pain starts.   Flex your knees up to your belly.   Lay on your side with a pillow under your belly (abdomen) and another one between your legs.   Sit in a hot bath for 15 to 20 minutes or until the pain goes away.  HOME CARE INSTRUCTIONS   Only take over-the-counter or prescriptions medicines for pain, discomfort or fever as directed by your caregiver.   Sit and stand slowly.   Avoid long walks if it  causes pain.   Stop or lessen your physical activities if it causes pain.  SEEK MEDICAL CARE IF:   The pain does not go away with any of your treatment.   You need stronger medication for the pain.   You develop back pain that you did not have before with the side pain.  SEEK IMMEDIATE MEDICAL CARE IF:   You develop a temperature of 102 F (38.9 C) or higher.   You develop uterine contractions.   You develop vaginal bleeding.   You develop nausea, vomiting or diarrhea.   You develop chills.   You have pain when you urinate.  Document Released: 11/04/2007 Document Revised: 01/14/2011 Document Reviewed: 11/04/2007 Exit[employer] Patient Information 2012 [employer]. Fetal Movement Counts Patient Name: __________________________________________________ Patient Due Date: ____________________ Kick counts is highly recommended in high risk pregnancies, but it is a good idea for every pregnant woman to do. Start counting fetal movements at 28 weeks of the pregnancy. Fetal movements increase after eating a full meal or eating or drinking something sweet (the blood sugar is higher). It is also important to drink plenty of fluids (well hydrated) before doing the count. Lie on your left side because it helps with the circulation or you can sit in a comfortable chair with your arms over your belly (abdomen) with no distractions around you. DOING   THE COUNT  Try to do the count the same time of day each time you do it.   Mark the day and time, then see how long it takes for you to feel 10 movements (kicks, flutters, swishes, rolls). You should have at least 10 movements within 2 hours. You will most likely feel 10 movements in much less than 2 hours. If you do not, wait an hour and count again. After a couple of days you will see a pattern.   What you are looking for is a change in the pattern or not enough counts in 2 hours. Is it taking longer in time to reach 10 movements?  SEEK MEDICAL CARE  IF:  You feel less than 10 counts in 2 hours. Tried twice.   No movement in one hour.   The pattern is changing or taking longer each day to reach 10 counts in 2 hours.   You feel the baby is not moving as it usually does.  Date: ____________ Movements: ____________ Start time: ____________ Finish time: ____________  Date: ____________ Movements: ____________ Start time: ____________ Finish time: ____________ Date: ____________ Movements: ____________ Start time: ____________ Finish time: ____________ Date: ____________ Movements: ____________ Start time: ____________ Finish time: ____________ Date: ____________ Movements: ____________ Start time: ____________ Finish time: ____________ Date: ____________ Movements: ____________ Start time: ____________ Finish time: ____________ Date: ____________ Movements: ____________ Start time: ____________ Finish time: ____________ Date: ____________ Movements: ____________ Start time: ____________ Finish time: ____________  Date: ____________ Movements: ____________ Start time: ____________ Finish time: ____________ Date: ____________ Movements: ____________ Start time: ____________ Finish time: ____________ Date: ____________ Movements: ____________ Start time: ____________ Finish time: ____________ Date: ____________ Movements: ____________ Start time: ____________ Finish time: ____________ Date: ____________ Movements: ____________ Start time: ____________ Finish time: ____________ Date: ____________ Movements: ____________ Start time: ____________ Finish time: ____________ Date: ____________ Movements: ____________ Start time: ____________ Finish time: ____________  Date: ____________ Movements: ____________ Start time: ____________ Finish time: ____________ Date: ____________ Movements: ____________ Start time: ____________ Finish time: ____________ Date: ____________ Movements: ____________ Start time: ____________ Finish time:  ____________ Date: ____________ Movements: ____________ Start time: ____________ Finish time: ____________ Date: ____________ Movements: ____________ Start time: ____________ Finish time: ____________ Date: ____________ Movements: ____________ Start time: ____________ Finish time: ____________ Date: ____________ Movements: ____________ Start time: ____________ Finish time: ____________  Date: ____________ Movements: ____________ Start time: ____________ Finish time: ____________ Date: ____________ Movements: ____________ Start time: ____________ Finish time: ____________ Date: ____________ Movements: ____________ Start time: ____________ Finish time: ____________ Date: ____________ Movements: ____________ Start time: ____________ Finish time: ____________ Date: ____________ Movements: ____________ Start time: ____________ Finish time: ____________ Date: ____________ Movements: ____________ Start time: ____________ Finish time: ____________ Date: ____________ Movements: ____________ Start time: ____________ Finish time: ____________  Date: ____________ Movements: ____________ Start time: ____________ Finish time: ____________ Date: ____________ Movements: ____________ Start time: ____________ Finish time: ____________ Date: ____________ Movements: ____________ Start time: ____________ Finish time: ____________ Date: ____________ Movements: ____________ Start time: ____________ Finish time: ____________ Date: ____________ Movements: ____________ Start time: ____________ Finish time: ____________ Date: ____________ Movements: ____________ Start time: ____________ Finish time: ____________ Date: ____________ Movements: ____________ Start time: ____________ Finish time: ____________  Date: ____________ Movements: ____________ Start time: ____________ Finish time: ____________ Date: ____________ Movements: ____________ Start time: ____________ Finish time: ____________ Date: ____________ Movements:  ____________ Start time: ____________ Finish time: ____________ Date: ____________ Movements: ____________ Start time: ____________ Finish time: ____________ Date: ____________ Movements: ____________ Start time: ____________ Finish time: ____________ Date: ____________ Movements: ____________ Start time: ____________ Finish time: ____________ Date:   ____________ Movements: ____________ Start time: ____________ Finish time: ____________  Date: ____________ Movements: ____________ Start time: ____________ Finish time: ____________ Date: ____________ Movements: ____________ Start time: ____________ Finish time: ____________ Date: ____________ Movements: ____________ Start time: ____________ Finish time: ____________ Date: ____________ Movements: ____________ Start time: ____________ Finish time: ____________ Date: ____________ Movements: ____________ Start time: ____________ Finish time: ____________ Date: ____________ Movements: ____________ Start time: ____________ Finish time: ____________ Date: ____________ Movements: ____________ Start time: ____________ Finish time: ____________  Date: ____________ Movements: ____________ Start time: ____________ Finish time: ____________ Date: ____________ Movements: ____________ Start time: ____________ Finish time: ____________ Date: ____________ Movements: ____________ Start time: ____________ Finish time: ____________ Date: ____________ Movements: ____________ Start time: ____________ Finish time: ____________ Date: ____________ Movements: ____________ Start time: ____________ Finish time: ____________ Date: ____________ Movements: ____________ Start time: ____________ Finish time: ____________ Document Released: 02/24/2006 Document Revised: 01/14/2011 Document Reviewed: 08/27/2008 Exit[employer] Patient Information 2012 [employer]. 

## 2011-05-27 ENCOUNTER — Ambulatory Visit (INDEPENDENT_AMBULATORY_CARE_PROVIDER_SITE_OTHER): Payer: Self-pay | Admitting: Family Medicine

## 2011-05-27 VITALS — BP 106/69 | Wt 187.0 lb

## 2011-05-27 DIAGNOSIS — Z3490 Encounter for supervision of normal pregnancy, unspecified, unspecified trimester: Secondary | ICD-10-CM

## 2011-05-27 DIAGNOSIS — Z3480 Encounter for supervision of other normal pregnancy, unspecified trimester: Secondary | ICD-10-CM

## 2011-05-27 NOTE — Patient Instructions (Signed)
Thank you for coming in today, it was good to see you We will get you an appointment at [facility] to discuss a trial of labor after caesarean If you have bleeding, leaking of fluid, regular contrcations or decreased fetal movement [facility]. I will see you back in two weeks.

## 2011-05-29 NOTE — Progress Notes (Signed)
Here for f/u OB visit.  Had early [place] due  to being unsure of LMP. [place] dates consistent with LMP, giving her GA of 30.0w Denies cramping, bleeding, leaking of fluid, vaginal discharge.  No nausea. Early glucola 113, repeat 132 Hx of prior C/S, would like TOLAC FHR as noted in vitals, BP ok. Undecided about pediatrician or contraception  Anatomy US: Female, no abnormalities seen on [place]  O:  See flowsheet  Assessment and Plan: Normal pregnancy  -Cont PNV  -[facility]AC   -Kick count reviewed  -F/u 2 weeks.  -Return in 2 weeks

## 2011-06-09 ENCOUNTER — Ambulatory Visit (INDEPENDENT_AMBULATORY_CARE_PROVIDER_SITE_OTHER): Payer: Self-pay | Admitting: Family Medicine

## 2011-06-09 DIAGNOSIS — Z3480 Encounter for supervision of other normal pregnancy, unspecified trimester: Secondary | ICD-10-CM

## 2011-06-09 NOTE — Patient Instructions (Signed)
Thank you for coming in today, it was good to see you Everything looks good today. Remember if you have vaginal bleeding, leaking of fluid or are not feeling baby move as she normally does go to [facility] I will see you back in 2 weeks.  Fetal Movement Counts Patient Name: __________________________________________________ Patient Due Date: ____________________ Melody Haver counts is highly recommended in high risk pregnancies, but it is a good idea for every pregnant woman to do. Start counting fetal movements at 28 weeks of the pregnancy. Fetal movements increase after eating a full meal or eating or drinking something sweet (the blood sugar is higher). It is also important to drink plenty of fluids (well hydrated) before doing the count. Lie on your left side because it helps with the circulation or you can sit in a comfortable chair with your arms over your belly (abdomen) with no distractions around you. DOING THE COUNT  Try to do the count the same time of day each time you do it.   Mark the day and time, then see how long it takes for you to feel 10 movements (kicks, flutters, swishes, rolls). You should have at least 10 movements within 2 hours. You will most likely feel 10 movements in much less than 2 hours. If you do not, wait an hour and count again. After a couple of days you will see a pattern.   What you are looking for is a change in the pattern or not enough counts in 2 hours. Is it taking longer in time to reach 10 movements?  SEEK MEDICAL CARE IF:  You feel less than 10 counts in 2 hours. Tried twice.   No movement in one hour.   The pattern is changing or taking longer each day to reach 10 counts in 2 hours.   You feel the baby is not moving as it usually does.  Date: ____________ Movements: ____________ Start time: ____________ Rebecca Serrano time: ____________  Date: ____________ Movements: ____________ Start time: ____________ Rebecca Serrano time: ____________ Date: ____________  Movements: ____________ Start time: ____________ Rebecca Serrano time: ____________ Date: ____________ Movements: ____________ Start time: ____________ Rebecca Serrano time: ____________ Date: ____________ Movements: ____________ Start time: ____________ Rebecca Serrano time: ____________ Date: ____________ Movements: ____________ Start time: ____________ Rebecca Serrano time: ____________ Date: ____________ Movements: ____________ Start time: ____________ Rebecca Serrano time: ____________ Date: ____________ Movements: ____________ Start time: ____________ Rebecca Serrano time: ____________  Date: ____________ Movements: ____________ Start time: ____________ Rebecca Serrano time: ____________ Date: ____________ Movements: ____________ Start time: ____________ Rebecca Serrano time: ____________ Date: ____________ Movements: ____________ Start time: ____________ Rebecca Serrano time: ____________ Date: ____________ Movements: ____________ Start time: ____________ Rebecca Serrano time: ____________ Date: ____________ Movements: ____________ Start time: ____________ Rebecca Serrano time: ____________ Date: ____________ Movements: ____________ Start time: ____________ Rebecca Serrano time: ____________ Date: ____________ Movements: ____________ Start time: ____________ Rebecca Serrano time: ____________  Date: ____________ Movements: ____________ Start time: ____________ Rebecca Serrano time: ____________ Date: ____________ Movements: ____________ Start time: ____________ Rebecca Serrano time: ____________ Date: ____________ Movements: ____________ Start time: ____________ Rebecca Serrano time: ____________ Date: ____________ Movements: ____________ Start time: ____________ Rebecca Serrano time: ____________ Date: ____________ Movements: ____________ Start time: ____________ Rebecca Serrano time: ____________ Date: ____________ Movements: ____________ Start time: ____________ Rebecca Serrano time: ____________ Date: ____________ Movements: ____________ Start time: ____________ Rebecca Serrano time: ____________  Date: ____________ Movements: ____________ Start time:  ____________ Rebecca Serrano time: ____________ Date: ____________ Movements: ____________ Start time: ____________ Rebecca Serrano time: ____________ Date: ____________ Movements: ____________ Start time: ____________ Rebecca Serrano time: ____________ Date: ____________ Movements: ____________ Start time: ____________ Rebecca Serrano time: ____________ Date: ____________ Movements: ____________ Start time: ____________ Rebecca Serrano time:  ____________ Date: ____________ Movements: ____________ Start time: ____________ Rebecca Serrano time: ____________ Date: ____________ Movements: ____________ Start time: ____________ Rebecca Serrano time: ____________  Date: ____________ Movements: ____________ Start time: ____________ Rebecca Serrano time: ____________ Date: ____________ Movements: ____________ Start time: ____________ Rebecca Serrano time: ____________ Date: ____________ Movements: ____________ Start time: ____________ Rebecca Serrano time: ____________ Date: ____________ Movements: ____________ Start time: ____________ Rebecca Serrano time: ____________ Date: ____________ Movements: ____________ Start time: ____________ Rebecca Serrano time: ____________ Date: ____________ Movements: ____________ Start time: ____________ Rebecca Serrano time: ____________ Date: ____________ Movements: ____________ Start time: ____________ Rebecca Serrano time: ____________  Date: ____________ Movements: ____________ Start time: ____________ Rebecca Serrano time: ____________ Date: ____________ Movements: ____________ Start time: ____________ Rebecca Serrano time: ____________ Date: ____________ Movements: ____________ Start time: ____________ Rebecca Serrano time: ____________ Date: ____________ Movements: ____________ Start time: ____________ Rebecca Serrano time: ____________ Date: ____________ Movements: ____________ Start time: ____________ Rebecca Serrano time: ____________ Date: ____________ Movements: ____________ Start time: ____________ Rebecca Serrano time: ____________ Date: ____________ Movements: ____________ Start time: ____________ Rebecca Serrano time: ____________  Date:  ____________ Movements: ____________ Start time: ____________ Rebecca Serrano time: ____________ Date: ____________ Movements: ____________ Start time: ____________ Rebecca Serrano time: ____________ Date: ____________ Movements: ____________ Start time: ____________ Rebecca Serrano time: ____________ Date: ____________ Movements: ____________ Start time: ____________ Rebecca Serrano time: ____________ Date: ____________ Movements: ____________ Start time: ____________ Rebecca Serrano time: ____________ Date: ____________ Movements: ____________ Start time: ____________ Rebecca Serrano time: ____________ Date: ____________ Movements: ____________ Start time: ____________ Rebecca Serrano time: ____________  Date: ____________ Movements: ____________ Start time: ____________ Rebecca Serrano time: ____________ Date: ____________ Movements: ____________ Start time: ____________ Rebecca Serrano time: ____________ Date: ____________ Movements: ____________ Start time: ____________ Rebecca Serrano time: ____________ Date: ____________ Movements: ____________ Start time: ____________ Rebecca Serrano time: ____________ Date: ____________ Movements: ____________ Start time: ____________ Rebecca Serrano time: ____________ Date: ____________ Movements: ____________ Start time: ____________ Rebecca Serrano time: ____________ Document Released: 02/24/2006 Document Revised: 01/14/2011 Document Reviewed: 08/27/2008 [employer] Patient Information 2012 [employer]

## 2011-06-09 NOTE — Progress Notes (Signed)
Here for f/u OB visit.  Had early [place] due to being unsure of LMP. [place] dates consistent with LMP, giving her GA of 31.6w Denies cramping, bleeding, leaking of fluid, vaginal discharge.  No nausea. Early glucola 113, repeat 132 Hx of prior C/S, seen at [facility] to discuss TOLAC with plan to proceed with this. FHR as noted in vitals, BP ok. Undecided about pediatrician or contraception  Anatomy US: Female, no abnormalities seen on [place]  O:  See flowsheet  Assessment and Plan: Normal pregnancy  -Cont PNV  -Kick count reviewed  -F/u 2 weeks.

## 2011-06-21 ENCOUNTER — Ambulatory Visit (INDEPENDENT_AMBULATORY_CARE_PROVIDER_SITE_OTHER): Payer: Self-pay | Admitting: Family Medicine

## 2011-06-21 DIAGNOSIS — Z3480 Encounter for supervision of other normal pregnancy, unspecified trimester: Secondary | ICD-10-CM

## 2011-06-21 NOTE — Patient Instructions (Signed)
Thank you for coming in today, it was good to see you Remember if you have any vaginal bleeding, leaking of fluid, or are not feeling your baby move well, [facility] I will see you back in 2 weeks

## 2011-06-22 NOTE — Progress Notes (Signed)
Here for f/u OB visit.  Had early [place] due to being unsure of LMP. [place] dates consistent with LMP, giving her GA of 33.4w Denies cramping, bleeding, leaking of fluid, vaginal discharge.  No nausea. Early glucola 113, repeat 132 Hx of prior C/S, seen at [facility] to discuss TOLAC with plan to proceed with this. FHR as noted in vitals, BP ok. Plans to use [facility] for pediatrician Undecided on contraception  Anatomy US: Female, no abnormalities seen on [place]  O:  See flowsheet  Assessment and Plan: Normal pregnancy  -Cont PNV  -Kick count reviewed  -pre term labor precautions  -F/u 2 weeks.

## 2011-07-07 ENCOUNTER — Ambulatory Visit (INDEPENDENT_AMBULATORY_CARE_PROVIDER_SITE_OTHER): Payer: Self-pay | Admitting: Family Medicine

## 2011-07-07 ENCOUNTER — Other Ambulatory Visit (HOSPITAL_COMMUNITY)
Admission: RE | Admit: 2011-07-07 | Discharge: 2011-07-07 | Disposition: A | Discharge status: Home / Self Care | Payer: Self-pay | Source: Ambulatory Visit | Attending: Family Medicine | Admitting: Family Medicine

## 2011-07-07 VITALS — BP 108/62 | Wt 194.0 lb

## 2011-07-07 DIAGNOSIS — Z113 Encounter for screening for infections with a predominantly sexual mode of transmission: Secondary | ICD-10-CM | POA: Insufficient documentation

## 2011-07-07 DIAGNOSIS — Z3480 Encounter for supervision of other normal pregnancy, unspecified trimester: Secondary | ICD-10-CM

## 2011-07-07 DIAGNOSIS — Z331 Pregnant state, incidental: Secondary | ICD-10-CM

## 2011-07-07 NOTE — Patient Instructions (Signed)
Thank you for coming in today, it was good to see you I would continue the antifungal cream on the area around your nipple, if it is still not improving we will try something different when you return. I would like for you to see our OB clinic at your next visit in one week, if there is nothing available you can go ahead and schedule with me.   Remember if you have any bleeding, leaking of fluid, or begin having regular contractions, or are not feeling baby move as she normally does go to [facility].

## 2011-07-09 LAB — STREP B DNA PROBE: GBSP: NEGATIVE

## 2011-07-11 NOTE — Progress Notes (Signed)
S: 26 yo G2P1001 here at 35w6 days with for routine prenatal visit by early [place].  Plans TOLAC this pregnancy Reports itchy rash on R nipple.  Had similar rash between breasts that resolved with clotrimazole.  Has been using on nipple for the past week.  No cracking or bleeding.  No pain.   She reports good fetal movement.  Denies bleeding, LOF, discharge, cramping or contractions.   O: Per flowsheet Breast with hyperpigmented, scaly area on R areola.  No surrounding erythema.    A/P Continue PNV Kick counts GBS done, GC/Chlamydia done Advised to continue coltrimazole on breast, f/u at next appt.as area has fungal appearance.   Pre-term labor precautions Next appt with OB clinic

## 2011-07-16 ENCOUNTER — Ambulatory Visit (INDEPENDENT_AMBULATORY_CARE_PROVIDER_SITE_OTHER): Payer: Self-pay | Admitting: Family Medicine

## 2011-07-16 DIAGNOSIS — Z3480 Encounter for supervision of other normal pregnancy, unspecified trimester: Secondary | ICD-10-CM

## 2011-07-16 NOTE — Patient Instructions (Signed)
It was a pleasure to see you today.  You are 37 weeks and 1 day along in your pregnancy.   I ask that you come back in 1 week for another OB visit with Dr Ashley Royalty.   Remember what we talked about regarding kick counts and signs of labor.   Normal Labor and Delivery Your caregiver must first be sure you are in labor. Signs of labor include:  You may pass what is called "the mucus plug" before labor begins. This is a small amount of blood stained mucus.   Regular uterine contractions.   The time between contractions get closer together.   The discomfort and pain gradually gets more intense.   Pains are mostly located in the back.   Pains get worse when walking.   The cervix (the opening of the uterus becomes thinner (begins to efface) and opens up (dilates).  Once you are in labor and admitted into the hospital or care center, your caregiver will do the following:  A complete physical examination.   Check your vital signs (blood pressure, pulse, temperature and the fetal heart rate).   Do a vaginal examination (using a sterile glove and lubricant) to determine:   The position (presentation) of the baby (head [vertex] or buttock first).   The level (station) of the baby's head in the birth canal.   The effacement and dilatation of the cervix.   You may have your pubic hair shaved and be given an enema depending on your caregiver and the circumstance.   An electronic monitor is usually placed on your abdomen. The monitor follows the length and intensity of the contractions, as well as the baby's heart rate.   Usually, your caregiver will insert an IV in your arm with a bottle of sugar water. This is done as a precaution so that medications can be given to you quickly during labor or delivery.  NORMAL LABOR AND DELIVERY IS DIVIDED UP INTO 3 STAGES: First Stage This is when regular contractions begin and the cervix begins to efface and dilate. This stage can last from 3 to 15  hours. The end of the first stage is when the cervix is 100% effaced and 10 centimeters dilated. Pain medications may be given by   Injection (morphine, demerol, etc.)   Regional anesthesia (spinal, caudal or epidural, anesthetics given in different locations of the spine). Paracervical pain medication may be given, which is an injection of and anesthetic on each side of the cervix.  A pregnant woman may request to have "Natural Childbirth" which is not to have any medications or anesthesia during her labor and delivery. Second Stage This is when the baby comes down through the birth canal (vagina) and is born. This can take 1 to 4 hours. As the baby's head comes down through the birth canal, you may feel like you are going to have a bowel movement. You will get the urge to bear down and push until the baby is delivered. As the baby's head is being delivered, the caregiver will decide if an episiotomy (a cut in the perineum and vagina area) is needed to prevent tearing of the tissue in this area. The episiotomy is sewn up after the delivery of the baby and placenta. Sometimes a mask with nitrous oxide is given for the mother to breath during the delivery of the baby to help if there is too much pain. The end of Stage 2 is when the baby is fully delivered. Then when  the umbilical cord stops pulsating it is clamped and cut. Third Stage The third stage begins after the baby is completely delivered and ends after the placenta (afterbirth) is delivered. This usually takes 5 to 30 minutes. After the placenta is delivered, a medication is given either by intravenous or injection to help contract the uterus and prevent bleeding. The third stage is not painful and pain medication is usually not necessary. If an episiotomy was done, it is repaired at this time. After the delivery, the mother is watched and monitored closely for 1 to 2 hours to make sure there is no postpartum bleeding (hemorrhage). If there is a lot  of bleeding, medication is given to contract the uterus and stop the bleeding. Document Released: 11/04/2007 Document Revised: 01/14/2011 Document Reviewed: 11/04/2007 [facility] Patient Information 2012 [place], [place].

## 2011-07-16 NOTE — Progress Notes (Signed)
Patient is G2P1 @ [redacted]w[redacted]d, for routine prenatal visit.  Reports some intermittent low back pain, but does not recognize these as contractions. Ample baby movement.  No vaginal discharge, bleeding or fluid loss. Seen MAU April 9 for decreased fetal movement, which has not recurred.  Reviewed culture results (cervical and GBS) from last visit, all negative.  Saw OB for TOLAC counseling, plans TOLAC.  [facility] for pediatrician for her daughter when born. Discussed dietary practices in light of weight gain (3 lbs in 9 days).  Follow up in 1 week with Dr. Ashley Royalty. Discussed labor precautions, kick counts, and when appropriate/indicated to present to MAU.  Paula Compton, MD

## 2011-07-23 ENCOUNTER — Encounter: Payer: Self-pay | Admitting: Family Medicine

## 2011-07-26 ENCOUNTER — Ambulatory Visit (INDEPENDENT_AMBULATORY_CARE_PROVIDER_SITE_OTHER): Payer: Self-pay | Admitting: Family Medicine

## 2011-07-26 VITALS — BP 120/74 | Wt 199.6 lb

## 2011-07-26 DIAGNOSIS — Z3480 Encounter for supervision of other normal pregnancy, unspecified trimester: Secondary | ICD-10-CM

## 2011-07-26 DIAGNOSIS — Z3490 Encounter for supervision of normal pregnancy, unspecified, unspecified trimester: Secondary | ICD-10-CM

## 2011-07-26 NOTE — Patient Instructions (Addendum)
Thank you for coming in today. Come back in 1 week with Dr. Ashley Royalty.  Good luck.

## 2011-07-26 NOTE — Progress Notes (Signed)
Patient is G2P1 @ [redacted]w[redacted]d, for routine prenatal visit.  Some occasional belly tightening that last less than one minute and occurs only a few times per hour irregularly.  This is without significant pain. She is not sure this is contractions. Ample baby movement.  No vaginal discharge, bleeding or fluid loss.    Saw OB for TOLAC counseling, plans TOLAC.  [facility] for pediatrician for her daughter when born. Discussed labor precautions.  Follow up in 1 week with Dr. Ashley Royalty. Discussed kick counts, and when appropriate/indicated to present to MAU.  Clementeen Graham, MD

## 2011-07-29 ENCOUNTER — Encounter: Payer: Self-pay | Admitting: Family Medicine

## 2011-08-02 ENCOUNTER — Ambulatory Visit (INDEPENDENT_AMBULATORY_CARE_PROVIDER_SITE_OTHER): Payer: Self-pay | Admitting: Family Medicine

## 2011-08-02 ENCOUNTER — Encounter: Payer: Self-pay | Admitting: Family Medicine

## 2011-08-02 DIAGNOSIS — Z3480 Encounter for supervision of other normal pregnancy, unspecified trimester: Secondary | ICD-10-CM

## 2011-08-02 NOTE — Progress Notes (Signed)
S: 26 yo G2P1001 here at 39.4 weeks by early [place].  Plans TOLAC this pregnancy She reports good fetal movement.  Denies bleeding, LOF, discharge, cramping or contractions.   O: Per flowsheet   A/P Continue PNV Kick counts Labor precautions F/u 1 week, will need to schedule NST if goes past next week Repeat GBS if goes post term.

## 2011-08-02 NOTE — Patient Instructions (Addendum)
Thank you for coming in today, it was good to see you If you have not had your baby by next week, we need to schedule you to have testing done to be sure baby looks ok If you go past 41 weeks we need to arrange for you to have an induction. I will see you back next week, if you have any vaginal bleeding, leaking of fluid or decreased fetal movement go to [facility].

## 2011-08-06 ENCOUNTER — Inpatient Hospital Stay (HOSPITAL_COMMUNITY)
Admission: AD | Admit: 2011-08-06 | Discharge: 2011-08-07 | Disposition: A | Discharge status: Home / Self Care | Payer: Self-pay | Source: Ambulatory Visit | Attending: Obstetrics and Gynecology | Admitting: Obstetrics and Gynecology

## 2011-08-06 DIAGNOSIS — O479 False labor, unspecified: Secondary | ICD-10-CM | POA: Insufficient documentation

## 2011-08-06 DIAGNOSIS — O34219 Maternal care for unspecified type scar from previous cesarean delivery: Secondary | ICD-10-CM

## 2011-08-06 DIAGNOSIS — O26859 Spotting complicating pregnancy, unspecified trimester: Secondary | ICD-10-CM | POA: Insufficient documentation

## 2011-08-06 NOTE — MAU Note (Signed)
Pt states, " I've had low back pain for a week, and today it comes and goes every two hours. When I went to the bathroom and wiped, I saw blood on the paper and a little in the tolite."

## 2011-08-07 ENCOUNTER — Encounter (HOSPITAL_COMMUNITY): Payer: Self-pay | Admitting: *Deleted

## 2011-08-07 DIAGNOSIS — O34219 Maternal care for unspecified type scar from previous cesarean delivery: Secondary | ICD-10-CM

## 2011-08-07 NOTE — Progress Notes (Signed)
Lisa CNM in [employer]. Aware of pt's admission and will see pt

## 2011-08-07 NOTE — Progress Notes (Signed)
CNM called and in delivery. Aware to come see pt

## 2011-08-07 NOTE — Progress Notes (Signed)
Up to BR and then to L side when back to bed

## 2011-08-07 NOTE — Discharge Instructions (Signed)
Informacin sobre la prueba de [place] de parto despus de Neomia Dear cesrea  (Trial of Labor After Cesarean Information) La prueba de [place] de parto despus de una cesrea (TOLAC por sus siglas en ingls) es cuando una mujer trata de dar a luz por va vaginal despus de una cesrea anterior. Cuando tiene [place], se denomina parto vaginal despus de una cesrea. La TOLAC puede ser Neomia Dear opcin segura y [place] segn la historia clnica y otros factores de [place].  Las posibilidades de un parto vaginal exitoso dependen de la razn por la que ya ha tenido una cesrea. Las mujeres tienen mayores tasas de xito si la cesrea se debi a:   Un parto de nalgas (presentacin fetal anmala).   Razones de Associate Professor.   Factores mdicos como la hipertensin.  Las mujeres tienen menores tasas de xito si la cesrea se IT trainer debido a que:   No tenan dilatacin.   No pudo empujar al beb hacia fuera.  Hable con su mdico sobre los beneficios, riesgos y porcentajes de [place]. Discuta sus planes de tener ms hijos. Una vez hecho esto, usted y su mdico pueden decidir si se debe intentar el TOLAC.  CANDIDATAS A TENER MS XITO  Este procedimiento es posible que algunas mujeres que:   Tienen una incisin horizontal (transversal baja) debido a una cesrea anterior.   Estn esperando gemelos y tuvieron una incisin transversal baja durante una cesrea.   El beb no es [address] grande (con [place]).   No tienen una cicatriz uterina vertical (clsica).   Estn en Aleen Campi de parto y tienen menos de 41 semanas de Psychiatrist.  El TOLAC tambin puede intentarse en mujeres que cumplen con los criterios apropiados:   Son menores de [address].   Son altas y tienen un ndice de masa corporal [facility]) inferior a 30.   Probablemente darn a luz a un beb de peso promedio o menor que el peso promedio de nacimiento.   Tienen una cicatriz uterina desconocida.   El parto se Education officer, environmental en un hospital con un equipo mdico  adecuado. Este equipo debe ser capaz de Company secretary las posibles complicaciones, como una ruptura uterina.   Tienen asesoramiento completo [employer] beneficios y riesgos del TOLAC.   Han comentado acerca de futuros planes de embarazo con su mdico.   Han planificado tener varios embarazos ms.  TOLAC puede ser ms apropiado para mujeres que cumplen con las normas anteriores y que planean tener ms embarazos. No se recomienda en partos domiciliarios.  CANDIDATAS A TENER MENOS XITO  Es menos exitoso en mujeres que:   Tienen un parto inducido con un cuello uterino desfavorable. Un cuello uterino desfavorable es cuando no se dilata suficientemente (entre otros factores).   Nunca han tenido un parto vaginal.   Tuvieron una cesrea anterior por fracaso del progreso del [place].   Tuvieron una cesrea anterior debido a patrones anormales de frecuencia cardaca (trazado poco confiable de la frecuencia cardaca fetal).   Tuvieron un beb macrosmico.   Son postrmino. Esto significa que el embarazo ha durado ms de 42 semanas a partir del Social worker del ltimo perodo menstrual.  No existen estudios de alta calidad que comparen los riesgos y beneficios de los partos por TOLAC y Comptroller repetida.  BENEFICIOS SUGERIDOS DEL TOLAC  Los beneficios son:   Wilburt Finlay un tiempo de recuperacin ms rpido.   Sufrir menos dolor que con Neomia Dear cesrea.   La pareja est involucrada en el proceso de parto.  RIESGOS SUGERIDOS DEL  TOLAC  El riesgo ms alto de complicaciones ocurre en mujeres que intentan un TOLAC y fracasan. Un TOLAC que fracasa resulta en una cesrea no planificada. Los riesgos relacionados con TOLAC o cesreas repetidas son:   Lulu Riding.   Infeccin.   Cogulos sanguneos.   Lesiones en los rganos o tejidos circundantes.   Extirpacin del tero (histerectoma).   Posibles problemas con la placenta (placenta previa, placenta acreta) en embarazos futuros.   Aunque es muy  raro, las preocupaciones principales son:   Ruptura de la cicatriz uterina de una cesrea anterior.   Necesidad de una cesrea de Associate Professor.   Mal resultado para el beb (morbilidad perinatal).   Tener embarazos muy seguidos (menos de 6 meses de diferencia).  VENTAJAS DE REPETIR EL PARTO POR CESREA   Es conveniente programar un parto por cesrea.   Una mujer puede fcilmente someterse a una operacin para Building control surveyor futuros (esterilizacin) durante una cesrea, si lo desea.   Hay una menor tasa de histerectomas en el futuro debido a que cada del tero (relajacin plvica).   Los riesgos asociados al TOLAC no son aplicables.  Irven Shelling MS INFORMACIN  [employer] of Obstetricians and Gynecologists ([employer] de Obstetras y Gineclogos): www.acog.org  [employer] of Nurse-Midwives (Colegio Estadounidense de Enfermeras - parteras): www.midwife.org  Document Released: 01/14/2011 [facility] Patient Information 2012 [place], [place].Parto vaginal luego de una cesrea (Vaginal Birth After Cesarean Delivery) Un parto vaginal luego de un parto por cesrea es dar a luz por la vagina luego de haber dado a luz por medio de una intervencin [place]. En el pasado, si una mujer tena un beb por cesrea, todos los partos posteriores deban hacerse por cesrea. Esto ya no es as. Puede ser seguro para la mam intentar un parto vaginal luego de una cesrea. La decisin final de tener un parto vaginal o por cesrea debe tomarse en conjunto, entre la paciente y el mdico. [place] riesgos y los beneficios debern evaluarse con relacin a los motivos y al tipo de cesrea previa LAS MUJERES QUE QUIEREN TENER UN PARTO VAGINAL, DEBEN CONSULTAR CON SU MDICO PARA ASEGURARSE QUE:  La cesrea anterior se realiz con una incisin uterina transversal (no con una incisin vertical clsica).   El canal de parto es lo suficientemente grande como para que pase el [place].   No ha sido  sometida a otras operaciones del tero.   Durante el trabajo de parto le realizarn un monitoreo electrnico fetal, en todo momento.   Es necesario que haya un quirfano disponible y listo en caso de necesitar una cesrea de emergencia.   Un cirujano y personal de quirfano estarn disponibles en todo momento durante el [place] de parto, para realizar una cesrea en caso de ser necesario.   Habr un anestesista disponible en caso de necesitar una cesrea de emergencia.   La nursery est lista cuenta con personal especializado y el equipo disponible para cuidar al beb en caso de emergencia.  BENEFICIOS  Permanencia ms breve en el hospital.   Menores costos en el parto, la nurse y el hospital.   Menos prdida de sangre y menos probabilidad de necesitar una transfusin sangunea.   Menos probabilidad de tener fiebre o molestias como consecuencia de una ciruga mayor   Menos riesgo de cogulos sanguneos.   Menos riesgo de sufrir infecciones.   Recuperacin ms rpida luego del alta mdica.   Menos riesgo de complicaciones quirrgicas, como apertura o hernia de la incisin.   Disminucin  del riesgo de lesiones a otros rganos   Menor riesgo de remocin del tero (histerectoma)   Menor riesgo de que la placenta cubra parcial o completamente la abertura del tero (placenta previa) en embarazos futuros   Posibilidad de tener una familia grande, si lo desea.  RIESGOS  Ruptura del tero.   Si el tero se rompe Production manager.   Todas las complicaciones de [place] mayor y lesiones en otros rganos.   Hemorragia excesiva, cogulos e infeccin.   [place] Puntuacin Apgar baja (mtodo que evala al recin nacido segn su apariencia, pulso, muecas, actividad y respiracion) y ms riesgos para el beb.   Hay mas riesgo de ruptura del tero si se induce o aumenta el trabajo de [place].   Hay un mayor riesgo de ruptura uterina si se usan medicamentos para  madurar el cuello.  NO DEBE LLEVARSE A CABO SI:  La cesrea previa se realiz con una incicin vertical (clsica) o con forma de T, o usted no sabe cul de [employer] han practicado.   Ha sufrido ruptura del tero.   Le han practicado una ciruga de tero.   Tiene problemas mdicos u obsttricos.   El beb est en problemas.   Tuvo dos cesreas previas y ningn parto vaginal.  OTRAS COSAS QUE DEBE SABER:  La anestesia peridural es segura.   Es seguro dar vuelta al beb si se encuentra de nalgas (intentar una versin ceflica externa).   Es seguro intentarlo en caso de mellizos.   Los embarazos de ms de 40 semanas no tienen xito con este procedimiento.   Hay un aumento de fracasos en embarazadas obesas.   Hay un aumento del porcentaje de fracasos si el beb pesa 4 Kg o ms.   Hay aumento en el porcentaje de fracasos si el intervalo entre la operacin cesrea y el parto vaginal es de menos de 19 meses.   Hay un aumento en el porcentaje de fracasos si ha sufrido preeclampsia hipertensin arterial, protenas en la orina e hinchazn del rostro y las extremidades.   El parto vaginal ser muy exitoso si tuvo un parto vaginal previo.   Tambin es [employer] caso que el [place] de parto comience espontneamente antes de la fecha.   El parto vaginal luego de Neomia Dear cesrea es similar a un parto espontneo vaginal normal.  Es importante que converse con su mdico desde comienzos del Psychiatrist de modo que pueda [employer], beneficios y opciones. De este modo tendr tiempo de decidir que es lo mejor en su caso particular en relacin a su parto por cesrea anterior. Hay que tener en cuenta que puede haber cambios en la madre durante el [place], lo que hace necesario cambiar su decisin o la del mdico. Los consejos, preocupaciones y decisiones debern documentarse en la historia clnica y debe ser firmada por todas las partes. Document Released: 07/14/2007 Document Revised:  01/14/2011 [facility] Patient Information 2012 [place], [place].

## 2011-08-07 NOTE — MAU Provider Note (Signed)
First Provider Initiated Contact with Patient 08/07/11 [phone]      Chief Complaint:  Labor Eval and Vaginal Bleeding   Rebecca Serrano is  26 y.o. G2P1001 at [redacted]w[redacted]d presents complaining of Labor Eval and Vaginal Bleeding .  She states irregular, every 5-7 minutes contractions are associated with minimal vaginal bleeding, intact membranes, along with active fetal movement. Fht is Cat I  Obstetrical/Gynecological History: Menstrual History: OB History    Grav Para Term Preterm Abortions TAB SAB Ect Mult Living   2 1 1  0 0 0 0 0 0 1     Patient's last menstrual period was 10/29/2010.     Past Medical History: Past Medical History  Diagnosis Date  . No pertinent past medical history     Past Surgical History: Past Surgical History  Procedure Date  . Cesarean section   DESIRES VBAC, HAS SIGNED VBAC CONSENT,   Family History: Family History  Problem Relation Age of Onset  . Diabetes Father   . Drug abuse Paternal Aunt   . Drug abuse Paternal Uncle   . Early death Paternal Grandfather   . Other Neg Hx     Social History: History  Substance Use Topics  . Smoking status: Never Smoker   . Smokeless tobacco: Never Used  . Alcohol Use: No    Allergies: No Known Allergies  Meds:  Prescriptions prior to admission  Medication Sig Dispense Refill  . Prenatal Vit-Fe Sulfate-FA (PRENATAL VITAMIN PO) Take 1 tablet by mouth daily.         Review of Systems - Please refer to the aforementioned patients' reports.     Physical Exam  Blood pressure 121/73, pulse 71, temperature 97.1 F (36.2 C), temperature source Oral, resp. rate 20, height 5' 2.25" (1.581 m), weight 92.647 kg (204 lb 4 oz), last menstrual period 10/29/2010. GENERAL: Well-developed, well-nourished female in no acute distress.  LUNGS: Clear to auscultation bilaterally.  HEART: Regular rate and rhythm. ABDOMEN: Soft, nontender, nondistended, gravid. NONTENDER LOWER UTERUS EXTREMITIES: Nontender, no edema, 2+  distal pulses. CERVICAL EXAM: Dilatation 1cm   Effacement 80%   Station -2 POSTERIOR   Presentation: cephalic FHT:  Baseline rate 145 bpm   Variability moderate  Accelerations present   Decelerations none Contractions: Every 5-10 mins   Labs: No results found for this or any previous visit (from the past 24 hour(s)). Imaging Studies:  No results found.  Assessment: Rebecca Serrano is  26 y.o. G2P1001 at [redacted]w[redacted]d presents with LIGHT SPOTTING, AND CONTRACTIONS, TO RULE OUT LABOR  Imp ; FALSE LABOR          FETAL WELLBEING CONFIRMED.Marland Kitchen  Plan: D/C HOME KEEP OB APPT AS SCHEDULED.  Tilda Burrow 6/29/20134:58 AM

## 2011-08-07 NOTE — Progress Notes (Signed)
Sharen Counter CNM updated as to pt's status.

## 2011-08-07 NOTE — Progress Notes (Signed)
Written and verbal d/c instructions given and understanding voiced. 

## 2011-08-07 NOTE — MAU Note (Signed)
Sharen Counter CNM in to see pt but had to leave for del in BS. RN to ck cervix.

## 2011-08-09 ENCOUNTER — Encounter: Payer: Self-pay | Admitting: Family Medicine

## 2011-08-09 ENCOUNTER — Inpatient Hospital Stay (HOSPITAL_COMMUNITY)
Admission: AD | Admit: 2011-08-09 | Discharge: 2011-08-12 | DRG: 775 | Disposition: A | Discharge status: Home / Self Care | Payer: Medicaid Other | Source: Ambulatory Visit | Attending: Obstetrics & Gynecology | Admitting: Obstetrics & Gynecology

## 2011-08-09 ENCOUNTER — Ambulatory Visit (INDEPENDENT_AMBULATORY_CARE_PROVIDER_SITE_OTHER): Payer: Medicaid Other | Admitting: Family Medicine

## 2011-08-09 ENCOUNTER — Encounter (HOSPITAL_COMMUNITY): Payer: Self-pay | Admitting: *Deleted

## 2011-08-09 VITALS — BP 128/79 | Temp 97.9°F | Wt 200.4 lb

## 2011-08-09 DIAGNOSIS — O480 Post-term pregnancy: Principal | ICD-10-CM | POA: Diagnosis present

## 2011-08-09 DIAGNOSIS — O34219 Maternal care for unspecified type scar from previous cesarean delivery: Secondary | ICD-10-CM

## 2011-08-09 DIAGNOSIS — Z3490 Encounter for supervision of normal pregnancy, unspecified, unspecified trimester: Secondary | ICD-10-CM

## 2011-08-09 DIAGNOSIS — Z3480 Encounter for supervision of other normal pregnancy, unspecified trimester: Secondary | ICD-10-CM

## 2011-08-09 NOTE — MAU Note (Signed)
Pt reports contractions and lower back pain, C/S due to failure to progress, and decrease fetal heartrate. Plans vaginal delivery

## 2011-08-09 NOTE — Patient Instructions (Addendum)
Your cervix is dilated about 1.5 cm. If you start having contractions every 5-7 minutes regularly, your water breaks, you feel decreased fetal movement, or vaginal bleeding, please to go to MAU. We will schedule your induction when you are [redacted] weeks gestation.  Normal Labor and Delivery Your caregiver must first be sure you are in labor. Signs of labor include:  You may pass what is called "the mucus plug" before labor begins. This is a small amount of blood stained mucus.   Regular uterine contractions.   The time between contractions get closer together.   The discomfort and pain gradually gets more intense.   Pains are mostly located in the back.   Pains get worse when walking.   The cervix (the opening of the uterus becomes thinner (begins to efface) and opens up (dilates).  Once you are in labor and admitted into the hospital or care center, your caregiver will do the following:  A complete physical examination.   Check your vital signs (blood pressure, pulse, temperature and the fetal heart rate).   Do a vaginal examination (using a sterile glove and lubricant) to determine:   The position (presentation) of the baby (head [vertex] or buttock first).   The level (station) of the baby's head in the birth canal.   The effacement and dilatation of the cervix.   You may have your pubic hair shaved and be given an enema depending on your caregiver and the circumstance.   An electronic monitor is usually placed on your abdomen. The monitor follows the length and intensity of the contractions, as well as the baby's heart rate.   Usually, your caregiver will insert an IV in your arm with a bottle of sugar water. This is done as a precaution so that medications can be given to you quickly during labor or delivery.  NORMAL LABOR AND DELIVERY IS DIVIDED UP INTO 3 STAGES: First Stage This is when regular contractions begin and the cervix begins to efface and dilate. This stage can  last from 3 to 15 hours. The end of the first stage is when the cervix is 100% effaced and 10 centimeters dilated. Pain medications may be given by   Injection (morphine, demerol, etc.)   Regional anesthesia (spinal, caudal or epidural, anesthetics given in different locations of the spine). Paracervical pain medication may be given, which is an injection of and anesthetic on each side of the cervix.  A pregnant woman may request to have "Natural Childbirth" which is not to have any medications or anesthesia during her labor and delivery. Second Stage This is when the baby comes down through the birth canal (vagina) and is born. This can take 1 to 4 hours. As the baby's head comes down through the birth canal, you may feel like you are going to have a bowel movement. You will get the urge to bear down and push until the baby is delivered. As the baby's head is being delivered, the caregiver will decide if an episiotomy (a cut in the perineum and vagina area) is needed to prevent tearing of the tissue in this area. The episiotomy is sewn up after the delivery of the baby and placenta. Sometimes a mask with nitrous oxide is given for the mother to breath during the delivery of the baby to help if there is too much pain. The end of Stage 2 is when the baby is fully delivered. Then when the umbilical cord stops pulsating it is clamped and cut.  Third Stage The third stage begins after the baby is completely delivered and ends after the placenta (afterbirth) is delivered. This usually takes 5 to 30 minutes. After the placenta is delivered, a medication is given either by intravenous or injection to help contract the uterus and prevent bleeding. The third stage is not painful and pain medication is usually not necessary. If an episiotomy was done, it is repaired at this time. After the delivery, the mother is watched and monitored closely for 1 to 2 hours to make sure there is no postpartum bleeding (hemorrhage).  If there is a lot of bleeding, medication is given to contract the uterus and stop the bleeding. Document Released: 11/04/2007 Document Revised: 01/14/2011 Document Reviewed: 11/04/2007 [facility] Patient Information 2012 [place], [place].

## 2011-08-09 NOTE — MAU Note (Addendum)
PT GOES TO [facility]-  WAS THERE AT 230PM TODAY-   SCHEDULED HER FOR INDUCTION  ON 7-2 AT 730 pm   VE 1.5 CM   .  DENIES HSV AND  MRSA.

## 2011-08-09 NOTE — Progress Notes (Signed)
26 yo G2P1001 here at 40.4 weeks by early [place].  Plans TOLAC this pregnancy  She reports good fetal movement. Denies vaginal bleeding but did have blood-tinged discharge on Friday, so she went to MAU. Was told that NST was reassuring and cervix was 1 cm dilated, discharged to home. Currently reports contractions every 15-20 minutes, irregular.   Exam:  Cervix 1.5 cm/80/-2  A/P  TOLAC - Induction scheduled for 7/2 @ 7:30 PM. NST done over the weekend at MAU. Kick counts and Labor precautions reviewed. Plans to use OCP after delivery, having a baby girl.

## 2011-08-10 ENCOUNTER — Encounter (HOSPITAL_COMMUNITY): Payer: Self-pay | Admitting: *Deleted

## 2011-08-10 ENCOUNTER — Inpatient Hospital Stay (HOSPITAL_COMMUNITY): Admission: RE | Admit: 2011-08-10 | Payer: Self-pay | Source: Ambulatory Visit

## 2011-08-10 ENCOUNTER — Inpatient Hospital Stay (HOSPITAL_COMMUNITY): Payer: Medicaid Other | Admitting: Anesthesiology

## 2011-08-10 ENCOUNTER — Encounter (HOSPITAL_COMMUNITY): Payer: Self-pay | Admitting: Anesthesiology

## 2011-08-10 DIAGNOSIS — O34219 Maternal care for unspecified type scar from previous cesarean delivery: Secondary | ICD-10-CM

## 2011-08-10 DIAGNOSIS — O480 Post-term pregnancy: Secondary | ICD-10-CM

## 2011-08-10 LAB — CBC
HCT: 37.7 % (ref 36.0–46.0)
Hemoglobin: 12.6 g/dL (ref 12.0–15.0)
MCH: 28.6 pg (ref 26.0–34.0)
MCHC: 33.4 g/dL (ref 30.0–36.0)
RDW: 13.9 % (ref 11.5–15.5)

## 2011-08-10 LAB — ABO/RH: ABO/RH(D): O POS

## 2011-08-10 LAB — PREPARE RBC (CROSSMATCH)

## 2011-08-10 MED ORDER — ONDANSETRON HCL 4 MG/2ML IJ SOLN
4.0000 mg | Freq: Four times a day (QID) | INTRAMUSCULAR | Status: DC | PRN
  Administered 2011-08-10: 4 mg via INTRAVENOUS
  Filled 2011-08-10: qty 2

## 2011-08-10 MED ORDER — DIPHENHYDRAMINE HCL 50 MG/ML IJ SOLN: 12.5000 mg | INTRAMUSCULAR | Status: DC | PRN

## 2011-08-10 MED ORDER — TERBUTALINE SULFATE 1 MG/ML IJ SOLN: 0.2500 mg | Freq: Once | INTRAMUSCULAR | Status: DC | PRN

## 2011-08-10 MED ORDER — OXYCODONE-ACETAMINOPHEN 5-325 MG PO TABS
1.0000 | ORAL_TABLET | ORAL | Status: DC | PRN
Start: 2011-08-10 — End: 2011-08-12
  Administered 2011-08-11 – 2011-08-12 (×5): 2 via ORAL
  Filled 2011-08-10 (×5): qty 2

## 2011-08-10 MED ORDER — EPHEDRINE 5 MG/ML INJ: 10.0000 mg | INTRAVENOUS | Status: DC | PRN

## 2011-08-10 MED ORDER — TETANUS-DIPHTH-ACELL PERTUSSIS 5-2.5-18.5 LF-MCG/0.5 IM SUSP
0.5000 mL | Freq: Once | INTRAMUSCULAR | Status: AC
  Administered 2011-08-11: 0.5 mL via INTRAMUSCULAR
  Filled 2011-08-10: qty 0.5

## 2011-08-10 MED ORDER — LACTATED RINGERS IV SOLN: 500.0000 mL | Freq: Once | INTRAVENOUS | Status: DC

## 2011-08-10 MED ORDER — WITCH HAZEL-GLYCERIN EX PADS: 1.0000 "application " | MEDICATED_PAD | CUTANEOUS | Status: DC | PRN

## 2011-08-10 MED ORDER — DIBUCAINE 1 % RE OINT: 1.0000 "application " | TOPICAL_OINTMENT | RECTAL | Status: DC | PRN

## 2011-08-10 MED ORDER — PRENATAL MULTIVITAMIN CH
1.0000 | ORAL_TABLET | Freq: Every day | ORAL | Status: DC
  Administered 2011-08-11 – 2011-08-12 (×2): 1 via ORAL
  Filled 2011-08-10 (×2): qty 1

## 2011-08-10 MED ORDER — FLEET ENEMA 7-19 GM/118ML RE ENEM: 1.0000 | ENEMA | RECTAL | Status: DC | PRN

## 2011-08-10 MED ORDER — CITRIC ACID-SODIUM CITRATE 334-500 MG/5ML PO SOLN
30.0000 mL | ORAL | Status: DC | PRN
  Administered 2011-08-10: 30 mL via ORAL
  Filled 2011-08-10: qty 15

## 2011-08-10 MED ORDER — PHENYLEPHRINE 40 MCG/ML (10ML) SYRINGE FOR IV PUSH (FOR BLOOD PRESSURE SUPPORT): 80.0000 ug | PREFILLED_SYRINGE | INTRAVENOUS | Status: DC | PRN

## 2011-08-10 MED ORDER — PHENYLEPHRINE 40 MCG/ML (10ML) SYRINGE FOR IV PUSH (FOR BLOOD PRESSURE SUPPORT)
80.0000 ug | PREFILLED_SYRINGE | INTRAVENOUS | Status: DC | PRN
  Filled 2011-08-10: qty 5

## 2011-08-10 MED ORDER — OXYCODONE-ACETAMINOPHEN 5-325 MG PO TABS
1.0000 | ORAL_TABLET | ORAL | Status: DC | PRN
  Administered 2011-08-10: 1 via ORAL
  Filled 2011-08-10: qty 1

## 2011-08-10 MED ORDER — OXYTOCIN BOLUS FROM INFUSION
250.0000 mL | Freq: Once | INTRAVENOUS | Status: DC
  Filled 2011-08-10: qty 500

## 2011-08-10 MED ORDER — LACTATED RINGERS IV SOLN
INTRAVENOUS | Status: DC
  Administered 2011-08-10 (×3): via INTRAVENOUS

## 2011-08-10 MED ORDER — BUTORPHANOL TARTRATE 2 MG/ML IJ SOLN: 1.0000 mg | INTRAMUSCULAR | Status: DC | PRN

## 2011-08-10 MED ORDER — SENNOSIDES-DOCUSATE SODIUM 8.6-50 MG PO TABS
2.0000 | ORAL_TABLET | Freq: Every day | ORAL | Status: DC
  Administered 2011-08-10: 2 via ORAL

## 2011-08-10 MED ORDER — ONDANSETRON HCL 4 MG/2ML IJ SOLN: 4.0000 mg | INTRAMUSCULAR | Status: DC | PRN

## 2011-08-10 MED ORDER — LIDOCAINE HCL (PF) 1 % IJ SOLN
30.0000 mL | INTRAMUSCULAR | Status: DC | PRN
  Filled 2011-08-10: qty 30

## 2011-08-10 MED ORDER — OXYTOCIN 40 UNITS IN LACTATED RINGERS INFUSION - SIMPLE MED
62.5000 mL/h | Freq: Once | INTRAVENOUS | Status: AC
  Administered 2011-08-10: 62.5 mL/h via INTRAVENOUS
  Filled 2011-08-10: qty 1000

## 2011-08-10 MED ORDER — SIMETHICONE 80 MG PO CHEW: 80.0000 mg | CHEWABLE_TABLET | ORAL | Status: DC | PRN

## 2011-08-10 MED ORDER — LACTATED RINGERS IV SOLN
500.0000 mL | INTRAVENOUS | Status: DC | PRN
Start: 2011-08-10 — End: 2011-08-10

## 2011-08-10 MED ORDER — ACETAMINOPHEN 325 MG PO TABS: 650.0000 mg | ORAL_TABLET | ORAL | Status: DC | PRN

## 2011-08-10 MED ORDER — LANOLIN HYDROUS EX OINT: TOPICAL_OINTMENT | CUTANEOUS | Status: DC | PRN

## 2011-08-10 MED ORDER — DIPHENHYDRAMINE HCL 25 MG PO CAPS: 25.0000 mg | ORAL_CAPSULE | Freq: Four times a day (QID) | ORAL | Status: DC | PRN

## 2011-08-10 MED ORDER — BENZOCAINE-MENTHOL 20-0.5 % EX AERO
1.0000 "application " | INHALATION_SPRAY | CUTANEOUS | Status: DC | PRN
  Administered 2011-08-11: 1 via TOPICAL
  Filled 2011-08-10: qty 56

## 2011-08-10 MED ORDER — ZOLPIDEM TARTRATE 5 MG PO TABS: 5.0000 mg | ORAL_TABLET | Freq: Every evening | ORAL | Status: DC | PRN

## 2011-08-10 MED ORDER — ONDANSETRON HCL 4 MG PO TABS: 4.0000 mg | ORAL_TABLET | ORAL | Status: DC | PRN

## 2011-08-10 MED ORDER — LIDOCAINE HCL (PF) 1 % IJ SOLN
INTRAMUSCULAR | Status: DC | PRN
  Administered 2011-08-10 (×3): 4 mL

## 2011-08-10 MED ORDER — EPHEDRINE 5 MG/ML INJ
10.0000 mg | INTRAVENOUS | Status: DC | PRN
  Filled 2011-08-10: qty 4

## 2011-08-10 MED ORDER — IBUPROFEN 600 MG PO TABS
600.0000 mg | ORAL_TABLET | Freq: Four times a day (QID) | ORAL | Status: DC | PRN
  Administered 2011-08-10: 600 mg via ORAL
  Filled 2011-08-10: qty 1

## 2011-08-10 MED ORDER — FENTANYL 2.5 MCG/ML BUPIVACAINE 1/10 % EPIDURAL INFUSION (WH - ANES)
14.0000 mL/h | INTRAMUSCULAR | Status: DC
  Administered 2011-08-10 (×2): 14 mL/h via EPIDURAL
  Filled 2011-08-10 (×4): qty 60

## 2011-08-10 MED ORDER — OXYTOCIN 40 UNITS IN LACTATED RINGERS INFUSION - SIMPLE MED
1.0000 m[IU]/min | INTRAVENOUS | Status: DC
  Administered 2011-08-10: 8 m[IU]/min via INTRAVENOUS
  Administered 2011-08-10: 2 m[IU]/min via INTRAVENOUS
  Administered 2011-08-10: 4 m[IU]/min via INTRAVENOUS
  Administered 2011-08-10: 666 m[IU]/min via INTRAVENOUS
  Administered 2011-08-10: 10 m[IU]/min via INTRAVENOUS
  Administered 2011-08-10: 6 m[IU]/min via INTRAVENOUS

## 2011-08-10 MED ORDER — IBUPROFEN 600 MG PO TABS
600.0000 mg | ORAL_TABLET | Freq: Four times a day (QID) | ORAL | Status: DC
  Administered 2011-08-11 – 2011-08-12 (×7): 600 mg via ORAL
  Filled 2011-08-10 (×7): qty 1

## 2011-08-10 NOTE — Progress Notes (Signed)
Patient ID: Rebecca Serrano, female   DOB: 10-27-1985, 26 y.o.   MRN: [id number] Rebecca Serrano is a 26 y.o. G2P1001 at [redacted]w[redacted]d admitted for IOL due to postdates, prior c/s contracting  Subjective: Comfortable.  Objective: BP 93/47  Pulse 73  Temp 97.6 F (36.4 C) (Oral)  Resp 20  Ht 5\' 2"  (1.575 m)  Wt 202 lb (91.627 kg)  BMI 36.95 kg/m2  SpO2 98%  LMP 10/29/2010  Fetal Heart FHR: 135 bpm, variability: moderate,  accelerations:  Present,  decelerations:  Absent   Contractions: q3-5  SVE:  2.5/80/-2 vtx; Foley bulb placed without difficulty  Assessment / Plan:  Labor: latent Fetal Wellbeing: Cat1 Pain Control:  epidural Expected mode of delivery: NSVD  Caylan Chenard 08/10/2011, 5:55 AM

## 2011-08-10 NOTE — MAU Note (Signed)
M. Mayford Knife, CNM AT BEDSIDE.

## 2011-08-10 NOTE — Progress Notes (Signed)
Subjective: Comfortable with epidural.  Objective: BP 141/72  Pulse 80  Temp 98.6 F (37 C) (Oral)  Resp 18  Ht 5\' 2"  (1.575 m)  Wt 91.627 kg (202 lb)  BMI 36.95 kg/m2  SpO2 98%  LMP 10/29/2010      FHT:  FHR: 140 bpm, variability: moderate,  accelerations:  Present,  decelerations:  Absent UC:   regular, every 4-5 minutes SVE:   Dilation: 5 Effacement (%): 80 Station: -1 Exam by:: Johnika Escareno, DO  Labs: Lab Results  Component Value Date   WBC 12.8* 08/10/2011   HGB 12.6 08/10/2011   HCT 37.7 08/10/2011   MCV 85.7 08/10/2011   PLT 258 08/10/2011    Assessment / Plan: Category 1 tracing.  Foley balloon d/c.  AROM with clear fluid.  Titrate pitocin.  Gordie Belvin JEHIEL 08/10/2011, 1:05 PM

## 2011-08-10 NOTE — H&P (Signed)
Rebecca Serrano is a 26 y.o. female presenting for contractions.   Maternal Medical History:  Reason for admission: Reason for admission: contractions.  Reason for Admission:   nauseaContractions: Onset was yesterday.   Frequency: irregular.   Duration is approximately 60 seconds.   Perceived severity is mild.    Fetal activity: Perceived fetal activity is normal.   Last perceived fetal movement was within the past hour.    Prenatal Complications - Diabetes: none.    OB History    Grav Para Term Preterm Abortions TAB SAB Ect Mult Living   2 1 1  0 0 0 0 0 0 1     Past Medical History  Diagnosis Date  . No pertinent past medical history    Past Surgical History  Procedure Date  . Cesarean section    Family History: family history includes Diabetes in her father; Drug abuse in her paternal aunt and paternal uncle; and Early death in her paternal grandfather.  There is no history of Other. Social History:  reports that she has never smoked. She has never used smokeless tobacco. She reports that she does not drink alcohol or use illicit drugs.   Prenatal Transfer Tool  Maternal Diabetes: No Genetic Screening: Normal Maternal Ultrasounds/Referrals: Normal Fetal Ultrasounds or other Referrals:  None Maternal Substance Abuse:  No Significant Maternal Medications:  None Significant Maternal Lab Results:  None Other Comments:  None  Review of Systems  Constitutional: Negative for fever and chills.  Eyes: Negative for blurred vision.  Respiratory: Negative for shortness of breath.   Cardiovascular: Negative for chest pain.  Gastrointestinal: Negative for heartburn, nausea and vomiting.  Neurological: Negative for headaches.    Dilation: 2.5 Effacement (%): 80 Station: -2;-3 Exam by:: D Poe, CNM Blood pressure 86/52, pulse 84, temperature 98.5 F (36.9 C), temperature source Oral, resp. rate 18, height 5\' 2"  (1.575 m), weight 91.627 kg (202 lb), last menstrual period  10/29/2010, SpO2 98.00%. Maternal Exam:  Uterine Assessment: Contraction strength is moderate.  Contraction frequency is irregular.   Abdomen: Surgical scars: low transverse.   Fundal height is 38.   Estimated fetal weight is 7lbs.   Fetal presentation: vertex  Introitus: Normal vulva. Normal vagina.  Pelvis: adequate for delivery.      Fetal Exam Fetal Monitor Review: Mode: fetoscope.   Baseline rate: 140.  Variability: moderate (6-25 bpm).   Pattern: accelerations present.    Fetal State Assessment: Category I - tracings are normal.     Physical Exam  Constitutional: She is oriented to person, place, and time. She appears well-nourished. No distress.  HENT:  Head: Normocephalic and atraumatic.  Neck: Neck supple.  Cardiovascular: Normal rate and regular rhythm.   Respiratory: Effort normal and breath sounds normal.  GI: Soft. Bowel sounds are normal. There is no tenderness.       Gravid   Musculoskeletal: She exhibits no edema and no tenderness.  Neurological: She is alert and oriented to person, place, and time.  Skin: Skin is warm and dry.    Prenatal labs: ABO, Rh: --/--/O POS (07/02 0400) Antibody: NEG (07/02 0400) Rubella: >500.0 (12/04 1024) RPR: NON REAC (03/28 0959)  HBsAg: NEGATIVE (12/04 1024)  HIV: NON REACTIVE (03/28 0959)  GBS: NEGATIVE (05/29 0904)   Assessment/Plan: VBAC IOL for post dates, foley bulb in place Epidural for pain control Contracting on her own at this time, will hold off on pitocin for now  Plans to breast and formula feed OCP for birth  control  Rebecca Serrano 08/10/2011, 7:05 AM

## 2011-08-10 NOTE — H&P (Signed)
Patient seen and examined.  Agree with above note.  Levie Heritage, DO 08/10/2011 3:13 PM

## 2011-08-10 NOTE — MAU Provider Note (Signed)
Chief Complaint:  Labor Eval   First Provider Initiated Contact with Patient 08/10/11 0113      HPI  Rebecca Serrano is a 26 y.o. G2P1001 at [redacted]w[redacted]d presenting with contractions. Contractions have been increasingly painful in uper abdomen and low back since they began this morning. She had runs of painful contractions yesterday as well. Had been dizzy when she was holding her breath with contractions earlier.   Denies leakage of fluid or vaginal bleeding. Good fetal movement.  After discussion again of risks/benefits elective C/S vs. TOLAC, wants to proceed with TOLAC. She is scheduled for IOL later today.   Pregnancy Course: uncomplicated at [facility] (Dr. Ashley Royalty)  Past Medical History: Past Medical History  Diagnosis Date  . No pertinent past medical history     Past Surgical History: Past Surgical History  Procedure Date  . Cesarean section   Maximum dilatation 2 cm   Family History: Family History  Problem Relation Age of Onset  . Diabetes Father   . Drug abuse Paternal Aunt   . Drug abuse Paternal Uncle   . Early death Paternal Grandfather   . Other Neg Hx     Social History: History  Substance Use Topics  . Smoking status: Never Smoker   . Smokeless tobacco: Never Used  . Alcohol Use: No    Allergies: No Known Allergies  Meds:  Prescriptions prior to admission  Medication Sig Dispense Refill  . Prenatal Vit-Fe Sulfate-FA (PRENATAL VITAMIN PO) Take 1 tablet by mouth daily.           Physical Exam  Blood pressure 124/96, pulse 84, temperature 98.3 F (36.8 C), temperature source Oral, resp. rate 20, height 5\' 2"  (1.575 m), weight 202 lb (91.627 kg), last menstrual period 10/29/2010, SpO2 98.00%. GENERAL: Well-developed, well-nourished female in apparent pain with UCs.  HEENT: normocephalic, good dentition HEART: normal rate RESP: normal effort ABDOMEN: Soft, nontender, gravid appropriate for gestational age EXTREMITIES: Nontender, no edema NEURO: alert and  oriented  SPECULUM EXAM:  Dilation: 1.5 Effacement (%): 80 Cervical Position: Posterior Station: -1 Presentation: Vertex Exam by:: DCALLAWAY, RN Dilation: 2.5 (STRIPPED MEMBRANES) Effacement (%): 80 Cervical Position: Posterior Station: -2 Presentation: Vertex Exam by:: Rebecca Serrano, CNM  FHT:  Baseline 135 , moderate variability, accelerations present, no decelerations Contractions: q 7 mins x 50-90 sec      Assessment: G2P1001 at [redacted]w[redacted]d Previous C-section desires TOLAC. Prodromal labor   Plan: Discussed with Dr. Ashley Royalty.  Will proceed with Foley bulb induction.     [name] 7/2/20132:11 AM

## 2011-08-10 NOTE — Anesthesia Procedure Notes (Signed)
Epidural Patient location during procedure: OB Start time: 08/10/2011 5:03 AM Reason for block: procedure for pain  Staffing Performed by: anesthesiologist   Preanesthetic Checklist Completed: patient identified, site marked, surgical consent, pre-op evaluation, timeout performed, IV checked, risks and benefits discussed and monitors and equipment checked  Epidural Patient position: sitting Prep: site prepped and draped and DuraPrep Patient monitoring: continuous pulse ox and blood pressure Approach: midline Injection technique: LOR air  Needle:  Needle type: Tuohy  Needle gauge: 17 G Needle length: 9 cm Needle insertion depth: 5 cm cm Catheter type: closed end flexible Catheter size: 19 Gauge Catheter at skin depth: 10 cm Test dose: negative  Assessment Events: blood not aspirated, injection not painful, no injection resistance, negative IV test and no paresthesia  Additional Notes Discussed risk of headache, infection, bleeding, nerve injury and failed or incomplete block.  Patient voices understanding and wishes to proceed.

## 2011-08-10 NOTE — Anesthesia Preprocedure Evaluation (Signed)
Anesthesia Evaluation  Patient identified by MRN, date of birth, ID band Patient awake    Reviewed: Allergy & Precautions, H&P , NPO status , Patient's Chart, lab work & pertinent test results, reviewed documented beta blocker date and time   History of Anesthesia Complications Negative for: history of anesthetic complications  Airway Mallampati: I TM Distance: >3 FB Neck ROM: full    Dental  (+) Teeth Intact   Pulmonary neg pulmonary ROS,  breath sounds clear to auscultation        Cardiovascular negative cardio ROS  Rhythm:regular Rate:Normal     Neuro/Psych negative neurological ROS  negative psych ROS   GI/Hepatic negative GI ROS, Neg liver ROS,   Endo/Other  Morbid obesity  Renal/GU negative Renal ROS     Musculoskeletal   Abdominal   Peds  Hematology negative hematology ROS (+)   Anesthesia Other Findings   Reproductive/Obstetrics (+) Pregnancy (h/o c/s x1, attempting VBAC)                           Anesthesia Physical Anesthesia Plan  ASA: II  Anesthesia Plan: Epidural   Post-op Pain Management:    Induction:   Airway Management Planned:   Additional Equipment:   Intra-op Plan:   Post-operative Plan:   Informed Consent: I have reviewed the patients History and Physical, chart, labs and discussed the procedure including the risks, benefits and alternatives for the proposed anesthesia with the patient or authorized representative who has indicated his/her understanding and acceptance.     Plan Discussed with:   Anesthesia Plan Comments:         Anesthesia Quick Evaluation

## 2011-08-11 LAB — CBC
Hemoglobin: 10.4 g/dL — ABNORMAL LOW (ref 12.0–15.0)
MCH: 28.7 pg (ref 26.0–34.0)
MCHC: 33.2 g/dL (ref 30.0–36.0)
Platelets: 208 10*3/uL (ref 150–400)
RDW: 14 % (ref 11.5–15.5)

## 2011-08-11 NOTE — Progress Notes (Addendum)
Post Partum Day #1 Subjective: no complaints, up ad lib, voiding, tolerating PO, + flatus and breast feeding.    Objective: Blood pressure 92/60, pulse 72, temperature 98.1 F (36.7 C), temperature source Oral, resp. rate 18, height 5\' 2"  (1.575 m), weight 91.627 kg (202 lb), last menstrual period 10/29/2010, SpO2 98.00%, unknown if currently breastfeeding.  Physical Exam:  General: alert and no distress Lochia: appropriate Uterine Fundus: firm Incision: n/a DVT Evaluation: No evidence of DVT seen on physical exam. Negative Homan's sign. No cords or calf tenderness. No significant calf/ankle edema.   Basename 08/11/11 0550 08/10/11 0315  HGB 10.4* 12.6  HCT 31.3* 37.7    Assessment/Plan: Plan for discharge tomorrow, Breastfeeding, Lactation consult and Contraception OCP's   LOS: 2 days   MATTHEWS,CODY 08/11/2011, 10:25 AM    I examined pt and agree with documentation above and resident plan of care. [facility]

## 2011-08-11 NOTE — Progress Notes (Signed)
UR chart review completed.  

## 2011-08-12 DIAGNOSIS — O34219 Maternal care for unspecified type scar from previous cesarean delivery: Secondary | ICD-10-CM

## 2011-08-12 LAB — TYPE AND SCREEN: ABO/RH(D): O POS

## 2011-08-12 MED ORDER — DOCUSATE SODIUM 100 MG PO CAPS: 100.0000 mg | ORAL_CAPSULE | Freq: Two times a day (BID) | ORAL | Status: AC | PRN

## 2011-08-12 MED ORDER — IBUPROFEN 600 MG PO TABS: 600.0000 mg | ORAL_TABLET | Freq: Three times a day (TID) | ORAL | Status: AC | PRN

## 2011-08-12 NOTE — Discharge Summary (Signed)
Pt seen by [facility] as well. Agree with above note.  Liset Mcmonigle H. 08/12/2011 3:50 PM

## 2011-08-12 NOTE — Discharge Summary (Signed)
Obstetric Discharge Summary Reason for Admission: onset of labor Prenatal Procedures: none Intrapartum Procedures: NSVD (Successful VBAC) Postpartum Procedures: none Complications-Operative and Postpartum: Right Labial laceration Hemoglobin  Date Value Range Status  08/11/2011 10.4* 12.0 - 15.0 g/dL Final     DELTA CHECK NOTED     REPEATED TO VERIFY     HCT  Date Value Range Status  08/11/2011 31.3* 36.0 - 46.0 % Final    Physical Exam:  General: alert, cooperative and no distress Lochia: appropriate Uterine Fundus: firm Incision: n/a DVT Evaluation: No evidence of DVT seen on physical exam. Negative Homan's sign. No cords or calf tenderness. No significant calf/ankle edema.  Discharge Diagnoses: Term Pregnancy-delivered  Discharge Information: Date: 08/12/2011 Activity: pelvic rest Diet: routine Medications: PNV, Ibuprofen and Colace Condition: stable Instructions: refer to practice specific booklet Discharge to: home Follow-up Information    Follow up with Rebecca Vitiello, DO. Schedule an appointment as soon as possible for a visit in 6 weeks.   Contact information:   [address] [place] [address] [phone]          Newborn Data: Live born female  Birth Weight: 8 lb 0.6 oz (3645 g) APGAR: 8, 9  Home with mother.  Rebecca Serrano 08/12/2011, 10:00 AM

## 2011-08-13 ENCOUNTER — Telehealth: Payer: Self-pay | Admitting: Family Medicine

## 2011-08-13 ENCOUNTER — Ambulatory Visit: Payer: Self-pay | Admitting: Sports Medicine

## 2011-08-13 NOTE — Telephone Encounter (Signed)
Paged Dr. Ashley Royalty x 2 . Not available. Advised patient to come in this afternoon for evaluation for pain not relieved with iburpofen following delivery.

## 2011-08-13 NOTE — Telephone Encounter (Signed)
Patient is calling after delivering on 7/2, was prescribed Ibuprofen for pain, but that isn't helping and she would like something stronger.

## 2011-08-22 NOTE — Anesthesia Postprocedure Evaluation (Signed)
Patient stable following vaginal delivery.  

## 2011-09-22 ENCOUNTER — Encounter: Payer: Self-pay | Admitting: Family Medicine

## 2011-09-22 ENCOUNTER — Ambulatory Visit (INDEPENDENT_AMBULATORY_CARE_PROVIDER_SITE_OTHER): Payer: Self-pay | Admitting: Family Medicine

## 2011-09-22 VITALS — BP 103/68 | HR 80 | Ht 62.0 in | Wt 181.0 lb

## 2011-09-22 DIAGNOSIS — Z309 Encounter for contraceptive management, unspecified: Secondary | ICD-10-CM

## 2011-09-22 MED ORDER — NORGESTIMATE-ETH ESTRADIOL 0.25-35 MG-MCG PO TABS: 1.0000 | ORAL_TABLET | Freq: Every day | ORAL | Status: DC

## 2011-09-22 NOTE — Patient Instructions (Addendum)
Thank you for coming in today, it was good to see you Everything looks good today. I have sent in a prescription for birth control pills for you to pick up at [facility] If you decide on a different form of birth control please let [place] know.

## 2011-09-22 NOTE — Assessment & Plan Note (Signed)
Doing well.  Low risk for PP depression.  She would like to start OCP for contraception, will start back on ortho cyclen since she liked this in the past.  She is to f/u as needed.

## 2011-09-22 NOTE — Progress Notes (Signed)
  Subjective:    Patient ID: Rebecca Serrano, female    DOB: 12/30/1985, 26 y.o.   MRN: [id number]  HPI  1. PP Check:  Here for 6 week PP check s/p NSVD (successful VBAC) without complication.  She was breastfeeding initially but is not currently.  No problems since delivery.  She and baby are doing well.  She would like to use OCP for contraception.  Has taken ortho-cyclen in the past and would like to have this again.  She denies any pain, difficulty with urination, vaginal bleeding or discharge.  She has not had a menstrual period since delivery.  She denies symptoms of depression and has a good support system at home.    Review of Systems Per HPI    Objective:   Physical Exam  Constitutional: She appears well-nourished. No distress.  Neck: Neck supple.  Cardiovascular: Normal rate and regular rhythm.   Pulmonary/Chest: Effort normal and breath sounds normal.  Genitourinary: Vagina normal and uterus normal. Cervix exhibits no motion tenderness. Right adnexum displays no tenderness and no fullness. Left adnexum displays no tenderness.          Assessment & Plan:

## 2012-05-23 ENCOUNTER — Encounter: Payer: Self-pay | Admitting: Family Medicine

## 2012-05-23 ENCOUNTER — Ambulatory Visit (INDEPENDENT_AMBULATORY_CARE_PROVIDER_SITE_OTHER): Payer: Self-pay | Admitting: Family Medicine

## 2012-05-23 VITALS — BP 117/95 | HR 89 | Temp 98.2°F | Wt 178.7 lb

## 2012-05-23 DIAGNOSIS — J988 Other specified respiratory disorders: Secondary | ICD-10-CM | POA: Insufficient documentation

## 2012-05-23 DIAGNOSIS — B9789 Other viral agents as the cause of diseases classified elsewhere: Secondary | ICD-10-CM

## 2012-05-23 MED ORDER — ALBUTEROL SULFATE HFA 108 (90 BASE) MCG/ACT IN AERS: 2.0000 | INHALATION_SPRAY | Freq: Four times a day (QID) | RESPIRATORY_TRACT | Status: DC | PRN

## 2012-05-23 MED ORDER — CETIRIZINE HCL 10 MG PO TABS: 10.0000 mg | ORAL_TABLET | Freq: Every day | ORAL | Status: DC

## 2012-05-23 MED ORDER — HYDROCODONE-HOMATROPINE 5-1.5 MG/5ML PO SYRP: 5.0000 mL | ORAL_SOLUTION | Freq: Three times a day (TID) | ORAL | Status: DC | PRN

## 2012-05-23 NOTE — Progress Notes (Signed)
Subjective:     Patient ID: Rebecca Serrano, female   DOB: 1985/07/24, 27 y.o.   MRN: [id number]  HPI Ms. Rebecca Serrano presents to the clinic with URI symptoms.  1) URI - Patient reports congestion, headache, runny nose, sneezing, and itchy/watery eyes x 4 days. - She also reports some associated SOB and persistent cough, particularly at night.  - Denies fever, chills, sore throat, N/V, sputum production - She has tried OTC Cold/Allergy, Mucinex, and Claritin with no relief.  Review of Systems See HPI    Objective:   Physical Exam Filed Vitals:   05/23/12 1438  BP: 117/95  Pulse: 89  Temp: 98.2 F (36.8 C)  General: well appearing female in NAD. HEENT: NCAT. Normal TM's bilaterally.  Mild pharyngeal erythema appreciated. No tonsillar exudate. No sinus tenderness upon palpation. No purulent nasal discharge. Heart: RRR. No mumurs, rubs, or gallops. Lungs: Diffuse inspiratory wheezing appreciated. No rales, or rhonchi.     Assessment:        Plan:

## 2012-05-23 NOTE — Patient Instructions (Addendum)
You have a viral infection.  I have prescribed Albuterol, Cough syrup and an antihistamine for your symptoms.  If you develop worsening SOB, Fever, chills, etc. Please return to be evaluated.  Bronchospasm A bronchospasm is when the tubes that carry air in and out of your lungs (bronchioles) become smaller. It is hard to breathe when this happens. A bronchospasm can be caused by:  Asthma.  Allergies.  Lung infection. HOME CARE   Do not  smoke. Avoid places that have secondhand smoke.  Dust your house often. Have your air ducts cleaned once or twice a year.  Find out what allergies may cause your bronchospasms.  Use your inhaler properly if you have one. Know when to use it.  Eat healthy foods and drink plenty of water.  Only take medicine as told by your doctor. GET HELP RIGHT AWAY IF:  You feel you cannot breathe or catch your breath.  You cannot stop coughing.  Your treatment is not helping you breathe better. MAKE SURE YOU:   Understand these instructions.  Will watch your condition.  Will get help right away if you are not doing well or get worse. Document Released: 11/22/2008 Document Revised: 04/19/2011 Document Reviewed: 11/22/2008 [facility] Patient Information 2013 [place], [place].

## 2012-05-23 NOTE — Assessment & Plan Note (Signed)
Symptoms and physical exam suggestive of viral etiology given lack of fever, systemic signs.   Will treat symptomatically, with Hycodan and Zyrtec.  Also giving Albuterol given SOB and wheezing on physical exam.   Patient instructed to return if develops fever, chills, worsening cough, SOB.

## 2012-08-24 ENCOUNTER — Other Ambulatory Visit: Payer: Self-pay | Admitting: Family Medicine

## 2012-09-19 ENCOUNTER — Ambulatory Visit: Payer: Self-pay

## 2012-11-18 IMAGING — US US OB DETAIL+14 WK
1 series · 12 of 28 positions shown · non-contrast
Comparison: none

[Series 1: us ob detail +14 wk · 12 of 84 slices shown]
[im 4/84]
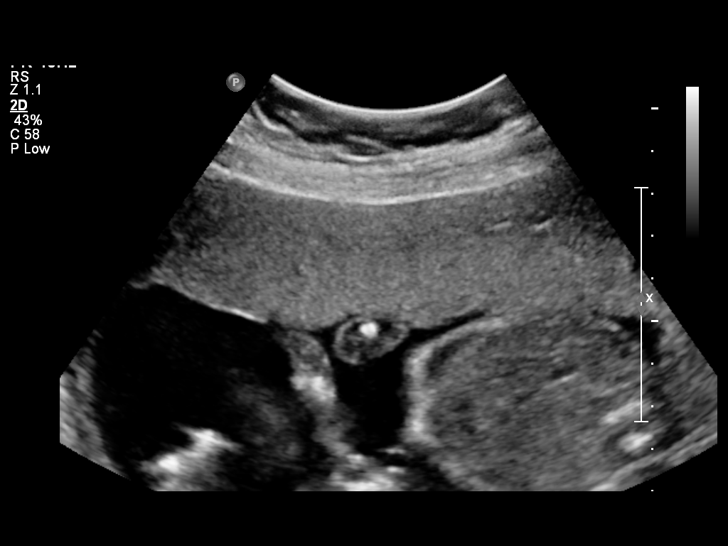
[im 10/84]
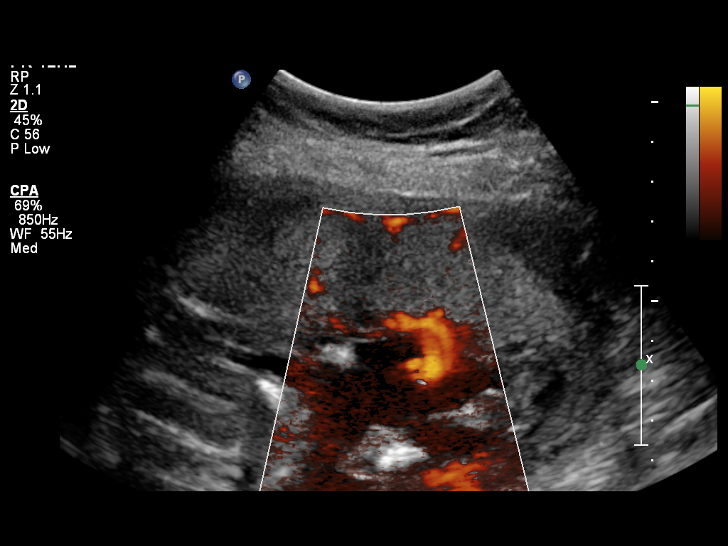
[im 16/84]
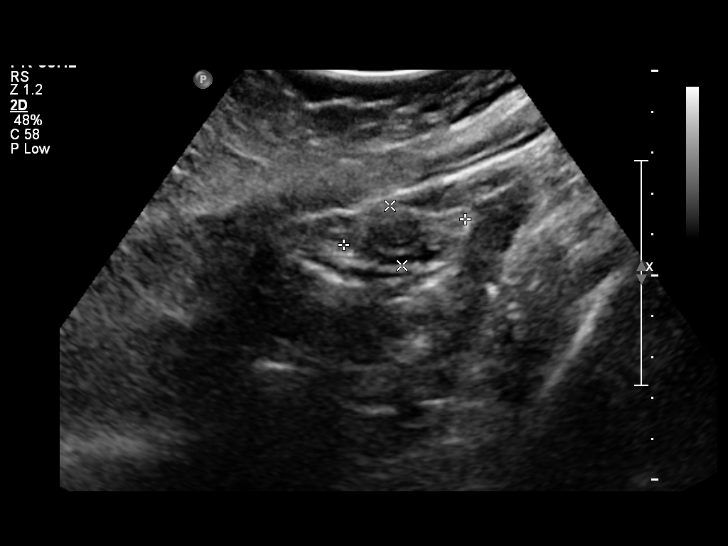
[im 25/84]
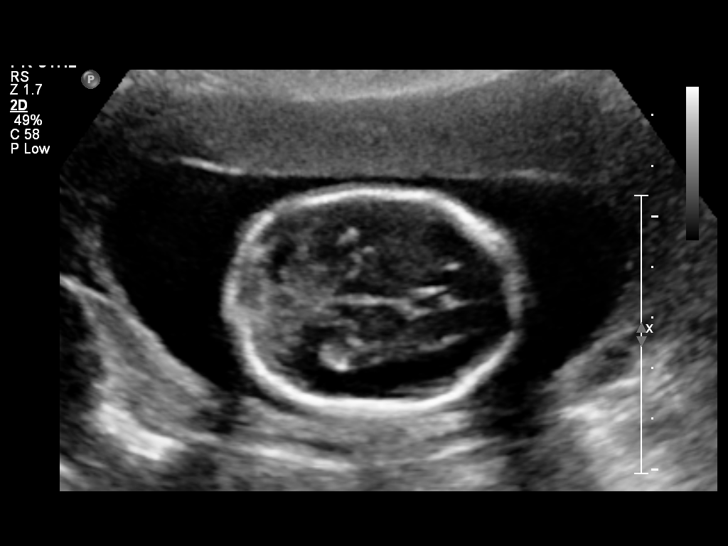
[im 31/84]
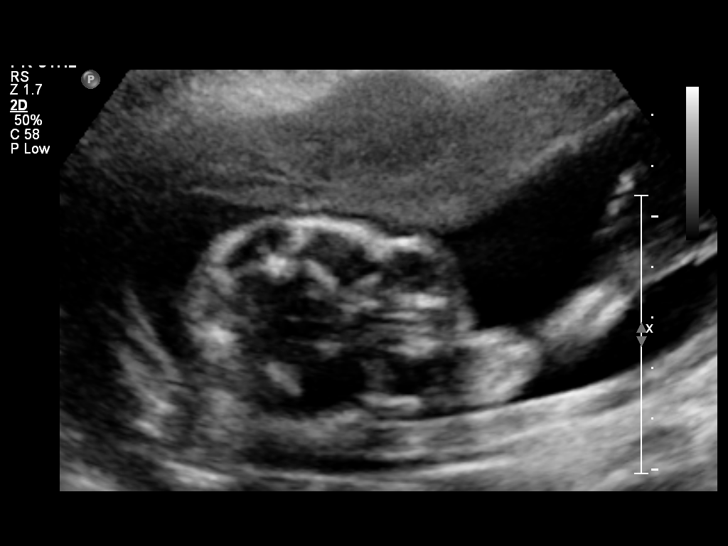
[im 37/84]
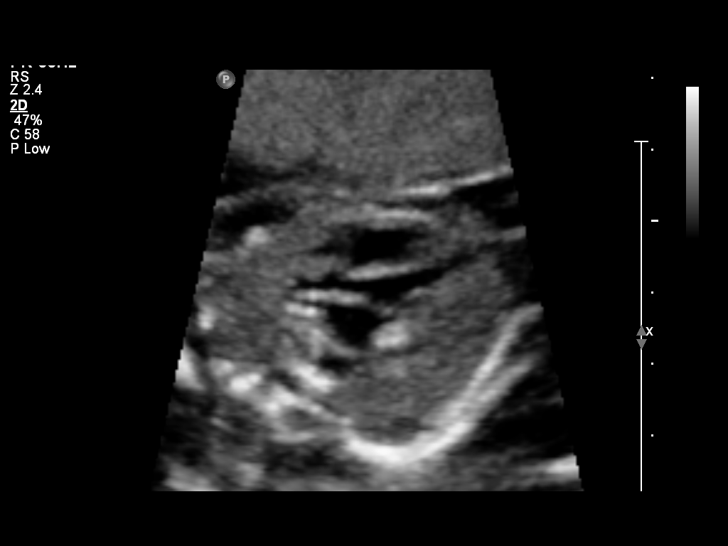
[im 47/84]
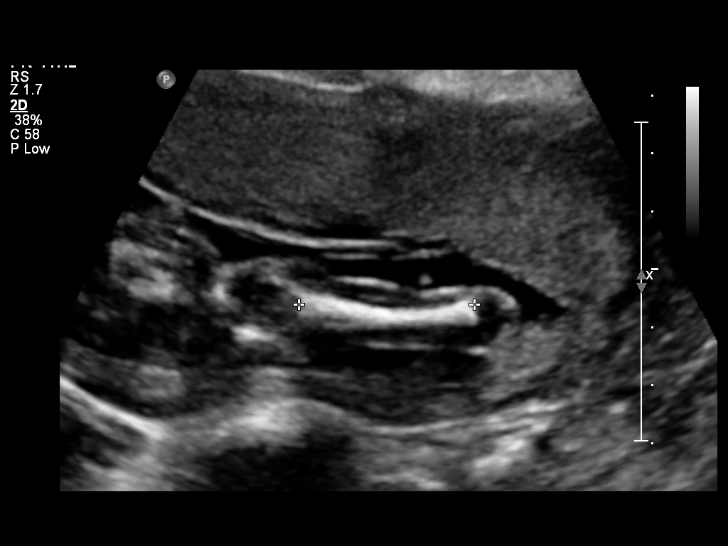
[im 53/84]
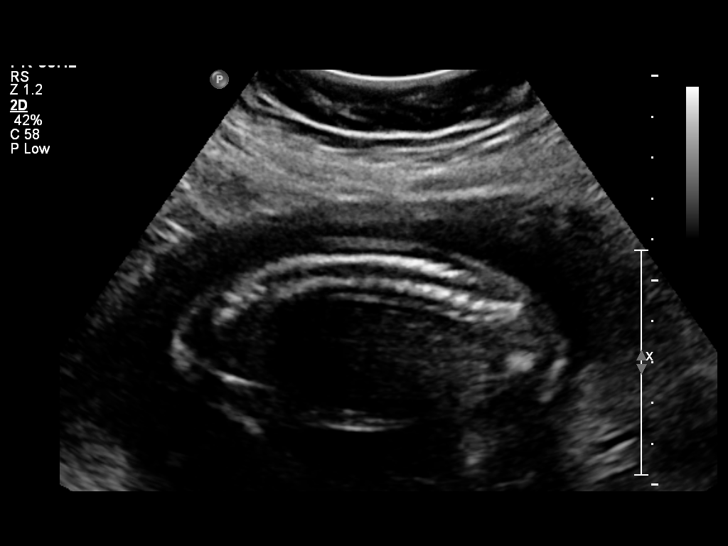
[im 59/84]
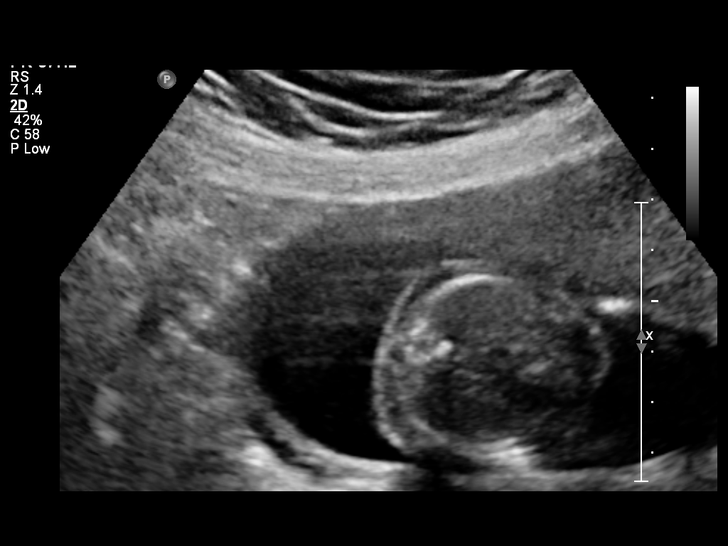
[im 68/84]
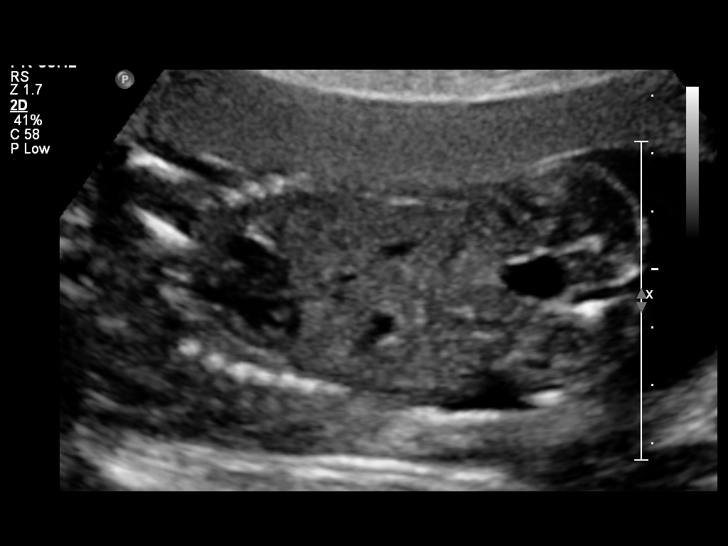
[im 74/84]
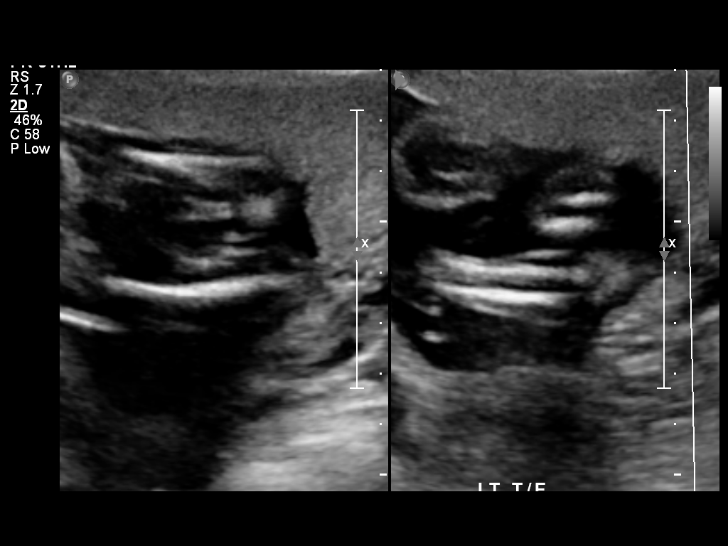
[im 80/84]
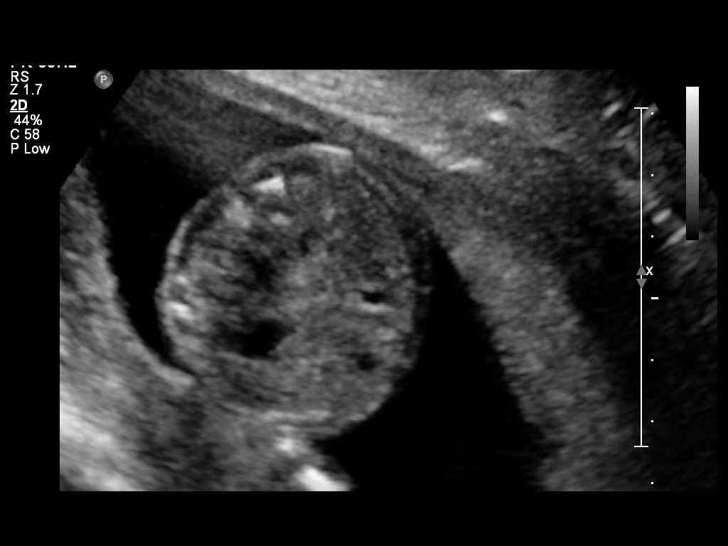

[12 of 28 positions shown; findings below may reference images not displayed]

OBSTETRICS REPORT
                      (Signed Final 03/17/2011 [DATE])

 Order#:         [id number]
Procedures

 US OB DETAIL + 14 WK                                  76811.0
Indications

 Detailed fetal anatomic survey
 Pelvic / Abdominal pain
 Previous cesarean section
Fetal Evaluation

 Fetal Heart Rate:  143                         bpm
 Cardiac Activity:  Observed
 Presentation:      Breech
 Placenta:          Anterior, above cervical os
 P. Cord            Visualized
 Insertion:

 Amniotic Fluid
 AFI FV:      Subjectively within normal limits
                                             Larg Pckt:     5.2  cm
Biometry

 BPD:     41.8  mm    G. Age:   18w 4d                CI:        63.92   70 - 86
                                                      FL/HC:      18.7   16.8 -

 HC:     168.3  mm    G. Age:   19w 4d       25  %    HC/AC:      1.13   1.09 -

 AC:     148.7  mm    G. Age:   20w 1d       54  %    FL/BPD:
 FL:      31.4  mm    G. Age:   19w 5d       39  %    FL/AC:      21.1   20 - 24
 HUM:     30.3  mm    G. Age:   20w 0d       57  %
 CER:     20.2  mm    G. Age:   19w 1d       36  %
 NFT:      4.2  mm

 Est. FW:     318  gm    0 lb 11 oz      50  %
Gestational Age

 LMP:           19w 6d       Date:   10/29/10                 EDD:   08/05/11
 U/S Today:     19w 3d                                        EDD:   08/08/11
 Best:          19w 6d    Det. By:   LMP  (10/29/10)          EDD:   08/05/11
Genetic Sonogram - Trisomy 21 Screening

 Age:                                             25          Risk=1:   826
 Echogenic bowel:                                 No
 Choroid plexus cysts:                            No
 Structural anomalies (inc. cardiac):             No
 Hypoplastic / absent midphalanx 5th Digit:       No
 2-vessel umbilical cord:                         No
 Pyelectasis:                                     No
 Echogenic cardiac foci:                          No
Anatomy

 Cranium:           Appears normal      Aortic Arch:       Appears normal
 Fetal Cavum:       Appears normal      Ductal Arch:       Appears normal
 Ventricles:        Appears normal      Diaphragm:         Appears normal
 Choroid Plexus:    Appears normal      Stomach:           Appears
                                                           normal, left
                                                           sided
 Cerebellum:        Appears normal      Abdomen:           Appears normal
 Posterior Fossa:   Appears normal      Abdominal Wall:    Appears nml
                                                           (cord insert,
                                                           abd wall)
 Nuchal Fold:       Appears normal      Cord Vessels:      Appears normal
                    (neck, nuchal                          (3 vessel cord)
                    fold)
 Face:              Lips and orbits     Kidneys:           Appear normal
                    appear normal
 Heart:             Appears normal      Bladder:           Appears normal
                    (4 chamber &
                    axis)
 RVOT:              Appears normal      Spine:             Appears normal
 LVOT:              Appears normal      Limbs:             Four extremities
                                                           seen

 Other:     Fetus appears to be a female. Rt 5th digit visualized.
Cervix Uterus Adnexa

 Cervical Length:   3.6       cm

 Cervix:       Normal appearance by transabdominal scan.
 Left Ovary:   Within normal limits.
 Right Ovary:  Within normal limits.

 Adnexa:     No abnormality visualized.
Impression

 Single living IUP.  US EGA is concordant with LMP.
 No fetal anomalies seen involving visualized anatomy.
 No sonographic markers for aneuploidy visualized.

 questions or concerns.

## 2013-01-08 ENCOUNTER — Ambulatory Visit (INDEPENDENT_AMBULATORY_CARE_PROVIDER_SITE_OTHER): Payer: No Typology Code available for payment source | Admitting: Family Medicine

## 2013-01-08 ENCOUNTER — Encounter: Payer: Self-pay | Admitting: Family Medicine

## 2013-01-08 VITALS — BP 119/82 | HR 80 | Temp 97.9°F | Ht 62.0 in | Wt 179.0 lb

## 2013-01-08 DIAGNOSIS — Z23 Encounter for immunization: Secondary | ICD-10-CM

## 2013-01-08 DIAGNOSIS — Z309 Encounter for contraceptive management, unspecified: Secondary | ICD-10-CM

## 2013-01-08 DIAGNOSIS — D649 Anemia, unspecified: Secondary | ICD-10-CM

## 2013-01-08 DIAGNOSIS — E669 Obesity, unspecified: Secondary | ICD-10-CM

## 2013-01-08 DIAGNOSIS — L538 Other specified erythematous conditions: Secondary | ICD-10-CM

## 2013-01-08 DIAGNOSIS — L253 Unspecified contact dermatitis due to other chemical products: Secondary | ICD-10-CM | POA: Insufficient documentation

## 2013-01-08 DIAGNOSIS — Z0000 Encounter for general adult medical examination without abnormal findings: Secondary | ICD-10-CM | POA: Insufficient documentation

## 2013-01-08 DIAGNOSIS — Z13220 Encounter for screening for lipoid disorders: Secondary | ICD-10-CM

## 2013-01-08 DIAGNOSIS — L304 Erythema intertrigo: Secondary | ICD-10-CM

## 2013-01-08 DIAGNOSIS — N393 Stress incontinence (female) (male): Secondary | ICD-10-CM

## 2013-01-08 LAB — POCT URINALYSIS DIPSTICK
Ketones, UA: NEGATIVE
Leukocytes, UA: NEGATIVE
Nitrite, UA: NEGATIVE
Protein, UA: NEGATIVE
Urobilinogen, UA: 0.2
pH, UA: 6

## 2013-01-08 LAB — POCT UA - MICROSCOPIC ONLY

## 2013-01-08 MED ORDER — NYSTATIN 100000 UNIT/GM EX POWD: CUTANEOUS | Status: DC

## 2013-01-08 MED ORDER — NORGESTIMATE-ETH ESTRADIOL 0.25-35 MG-MCG PO TABS: 1.0000 | ORAL_TABLET | Freq: Every day | ORAL | Status: DC

## 2013-01-08 NOTE — Assessment & Plan Note (Signed)
Refilled sprintec 

## 2013-01-08 NOTE — Assessment & Plan Note (Signed)
PAP due in December 2015.  Check lipid panel, CMP and CBC Flu shot given today

## 2013-01-08 NOTE — Assessment & Plan Note (Addendum)
Symptoms and exam consistent with intertrigo between breast.  Trial of nystatin powder

## 2013-01-08 NOTE — Assessment & Plan Note (Signed)
Irritation of axilla likely from excess dryness from deodorant.  Recommended avoid antiperspirent deodorant and avoiding too many chemicals on skin.

## 2013-01-08 NOTE — Patient Instructions (Addendum)
Please return to the clinic for a lab appointment to check your cholesterol.  Come fasting: no food or drink other than water after midnight.   For the leaking of urine, we will check a urine, but I suspect this is from urge and stress incontinence.  Do the Keggle exercises that we talked about.   For the itching under the arms, try to avoid anti-perspirant deodorants.   Kegel Exercises The goal of Kegel exercises is to isolate and exercise your pelvic floor muscles. These muscles act as a hammock that supports the rectum, vagina, small intestine, and uterus. As the muscles weaken, the hammock sags and these organs are displaced from their normal positions. Kegel exercises can strengthen your pelvic floor muscles and help you to improve bladder and bowel control, improve sexual response, and help reduce many problems and some discomfort during pregnancy. Kegel exercises can be done anywhere and at any time. HOW TO PERFORM KEGEL EXERCISES 1. Locate your pelvic floor muscles. To do this, squeeze (contract) the muscles that you use when you try to stop the flow of urine. You will feel a tightness in the vaginal area (women) and a tight lift in the rectal area (men and women). 2. When you begin, contract your pelvic muscles tight for 2 5 seconds, then relax them for 2 5 seconds. This is one set. Do 4 5 sets with a short pause in between. 3. Contract your pelvic muscles for 8 10 seconds, then relax them for 8 10 seconds. Do 4 5 sets. If you cannot contract your pelvic muscles for 8 10 seconds, try 5 7 seconds and work your way up to 8 10 seconds. Your goal is 4 5 sets of 10 contractions each day. Keep your stomach, buttocks, and legs relaxed during the exercises. Perform sets of both short and long contractions. Vary your positions. Perform these contractions 3 4 times per day. Perform sets while you are:   Lying in bed in the morning.  Standing at lunch.  Sitting in the late afternoon.  Lying in  bed at night. You should do 40 50 contractions per day. Do not perform more Kegel exercises per day than recommended. Overexercising can cause muscle fatigue. Continue these exercises for for at least 15 20 weeks or as directed by your caregiver. Document Released: 01/12/2012 Document Reviewed: 01/12/2012 [facility] Patient Information 2014 [place], [place].  Intertrigo Intertrigo is a skin condition that occurs in between folds of skin in places on the body that rub together a lot and do not get much ventilation. It is caused by heat, moisture, friction, sweat retention, and lack of air circulation, which produces red, irritated patches and, sometimes, scaling or drainage. People who have diabetes, who are obese, or who have treatment with antibiotics are at increased risk for intertrigo. The most common sites for intertrigo to occur include:  The groin.  The breasts.  The armpits.  Folds of abdominal skin.  Webbed spaces between the fingers or toes. Intertrigo may be aggravated by:  Sweat.  Feces.  Yeast or bacteria that are present near skin folds.  Urine.  Vaginal discharge. HOME CARE INSTRUCTIONS  The following steps can be taken to reduce friction and keep the affected area cool and dry:  Expose skin folds to the air.  Keep deep skin folds separated with cotton or linen cloth. Avoid tight fitting clothing that could cause chafing.  Wear open-toed shoes or sandals to help reduce moisture between the toes.  Apply absorbent powders  to affected areas as directed by your caregiver.  Apply over-the-counter barrier pastes, such as zinc oxide, as directed by your caregiver.  If you develop a fungal infection in the affected area, your caregiver may have you use antifungal creams. SEEK MEDICAL CARE IF:   The rash is not improving after 1 week of treatment.  The rash is getting worse (more red, more swollen, more painful, or spreading).  You have a fever or chills. MAKE  SURE YOU:   Understand these instructions.  Will watch your condition.  Will get help right away if you are not doing well or get worse. Document Released: 01/25/2005 Document Revised: 04/19/2011 Document Reviewed: 07/10/2009 [facility] Patient Information 2014 [place], [place].

## 2013-01-08 NOTE — Assessment & Plan Note (Signed)
Check fasting lipid and CMP.

## 2013-01-08 NOTE — Progress Notes (Addendum)
Patient ID: Rebecca Serrano    DOB: 12-20-1985, 27 y.o.   MRN: [id number] --- Subjective:  [name] is a 27 y.o.female who presents for routine physical exam with the following concerns:  - urinary incontinence: noticed it 6 months ago. Delivered baby vaginally in 2013. She reports leaking small amounts of urine with coughing and sneezing. Wears protective pads. Also sometimes has urge before making it to the bathroom. No dysuria. Feels like she has increased frequency. No abdominal pain.   - rash in between breasts: becomes red at times, itchy skin, in between breasy. Intermittent for the last 3 years. Has tried different over the counter medications which have not helped. Sweaty around area. Itchy and dry armpits bilaterally. Uses deodorant every day. Feels like they can get very dry and itchy at times, with flaky skin. Sometimes also get red.   - gyn history:  Pap smear: no history of abnormal PAP smears. Last normal: December 2012.  sprintec for birth control. Doesn't express need for STD screen No masses or lumps noted on breasts No family history of gyn cancers   ROS: see HPI Past Medical History: reviewed and updated medications and allergies. Social History: Tobacco: none   Objective: Filed Vitals:   01/08/13 0916  BP: 119/82  Pulse: 80  Temp: 97.9 F (36.6 C)    Physical Examination:   General appearance - alert, well appearing, and in no distress Nose - normal and patent, no erythema, discharge or polyps Mouth - mucous membranes moist, pharynx normal without lesions Neck - supple, no significant adenopathy Chest - clear to auscultation, no wheezes, rales or rhonchi, symmetric air entry Heart - normal rate, regular rhythm, normal S1, S2, 2/6 flow murmur best heard at right sternal border Abdomen - soft, nontender, nondistended, no masses or organomegaly Pelvic exam: normal external genitalia, vulva, vagina, cervix, no cystocele noted  Skin - pinkish symmetric patch in between  breasts Axilla- dry, flaking skin, no rash or erythema

## 2013-01-08 NOTE — Addendum Note (Signed)
Addended by: Jennette Bill on: 01/08/2013 01:22 PM   Modules accepted: Orders

## 2013-01-08 NOTE — Assessment & Plan Note (Signed)
Likely from pelvic floor weakening after pregnancy. Mixed urge and stress. No abnormality of pelvic exam.  Reviewed Kegel exercises with patient. Check UA.  If no better in 6 months, return for further evaluation.

## 2013-01-11 ENCOUNTER — Encounter: Payer: Self-pay | Admitting: Family Medicine

## 2013-01-17 ENCOUNTER — Other Ambulatory Visit (INDEPENDENT_AMBULATORY_CARE_PROVIDER_SITE_OTHER): Payer: No Typology Code available for payment source

## 2013-01-17 DIAGNOSIS — Z13220 Encounter for screening for lipoid disorders: Secondary | ICD-10-CM

## 2013-01-17 DIAGNOSIS — D649 Anemia, unspecified: Secondary | ICD-10-CM

## 2013-01-17 LAB — CBC
HCT: 40.8 % (ref 36.0–46.0)
MCH: 30.4 pg (ref 26.0–34.0)
MCV: 88.7 fL (ref 78.0–100.0)
Platelets: 385 10*3/uL (ref 150–400)
RDW: 12.7 % (ref 11.5–15.5)
WBC: 8.2 10*3/uL (ref 4.0–10.5)

## 2013-01-17 LAB — LIPID PANEL
HDL: 42 mg/dL (ref >39)
LDL Cholesterol: 110 mg/dL — ABNORMAL HIGH (ref 0–99)
Total CHOL/HDL Ratio: 4.7 Ratio
Triglycerides: 228 mg/dL — ABNORMAL HIGH (ref <150)
VLDL: 46 mg/dL — ABNORMAL HIGH (ref 0–40)

## 2013-01-17 LAB — COMPREHENSIVE METABOLIC PANEL
ALT: 36 U/L — ABNORMAL HIGH (ref 0–35)
Alkaline Phosphatase: 89 U/L (ref 39–117)
CO2: 23 mEq/L (ref 19–32)
Creat: 0.58 mg/dL (ref 0.50–1.10)
Sodium: 135 mEq/L (ref 135–145)
Total Bilirubin: 0.4 mg/dL (ref 0.3–1.2)
Total Protein: 7.4 g/dL (ref 6.0–8.3)

## 2013-01-17 NOTE — Progress Notes (Signed)
CMP,FLP AND CBC DONE TODAY Caelie [place] 

## 2013-03-14 ENCOUNTER — Ambulatory Visit: Payer: No Typology Code available for payment source

## 2013-05-24 ENCOUNTER — Ambulatory Visit: Payer: No Typology Code available for payment source | Admitting: Family Medicine

## 2013-05-25 ENCOUNTER — Encounter: Payer: Self-pay | Admitting: Family Medicine

## 2013-05-29 ENCOUNTER — Ambulatory Visit (INDEPENDENT_AMBULATORY_CARE_PROVIDER_SITE_OTHER): Payer: No Typology Code available for payment source | Admitting: Family Medicine

## 2013-05-29 ENCOUNTER — Encounter: Payer: Self-pay | Admitting: Family Medicine

## 2013-05-29 VITALS — BP 130/88 | HR 81 | Temp 97.5°F | Ht 62.0 in | Wt 175.0 lb

## 2013-05-29 DIAGNOSIS — J302 Other seasonal allergic rhinitis: Secondary | ICD-10-CM

## 2013-05-29 DIAGNOSIS — L538 Other specified erythematous conditions: Secondary | ICD-10-CM

## 2013-05-29 DIAGNOSIS — L304 Erythema intertrigo: Secondary | ICD-10-CM

## 2013-05-29 DIAGNOSIS — K089 Disorder of teeth and supporting structures, unspecified: Secondary | ICD-10-CM

## 2013-05-29 DIAGNOSIS — K0889 Other specified disorders of teeth and supporting structures: Secondary | ICD-10-CM | POA: Insufficient documentation

## 2013-05-29 DIAGNOSIS — J309 Allergic rhinitis, unspecified: Secondary | ICD-10-CM

## 2013-05-29 MED ORDER — FLUTICASONE PROPIONATE 50 MCG/ACT NA SUSP: 2.0000 | Freq: Every day | NASAL | Status: DC

## 2013-05-29 MED ORDER — NYSTATIN 100000 UNIT/GM EX POWD: CUTANEOUS | Status: DC

## 2013-05-29 MED ORDER — CETIRIZINE HCL 10 MG PO TABS: 10.0000 mg | ORAL_TABLET | Freq: Every day | ORAL | Status: DC

## 2013-05-29 NOTE — Assessment & Plan Note (Signed)
Will try treating for intertrigo with nystatin powder

## 2013-05-29 NOTE — Progress Notes (Signed)
Patient ID: [name]    DOB: [birth year], 28 y.o.   MRN: [id number] --- Subjective:  Rebecca Serrano is a 28 y.o.female who presents with concern of seasonal allergies.  - in the last couple of months, has been having nasal congestion and sinus pressure. Mild rhinorrhea. No cough. Itchy throat and watery, itchy eyes. She also has had some pressure in the ears and sneezing. She denies any fevers, no purulent nasal discharge.  Symptoms are persistent, worsening over time. Tried claritin which made her sleepy. Tried allegra which gave her a rash. She has been taking another type of allergy pill which helped for sometime but recently stopped helping.   - dental pain: throughout the mouth. Worst with eating cold or hot foods. Gets better after not eating certain foods. x2 months. No swelling of the mouth. Has not seen a dentist recently.   ROS: see HPI Past Medical History: reviewed and updated medications and allergies. Social History: Tobacco: none  Objective: Filed Vitals:   05/29/13 1509  BP: 130/88  Pulse: 81  Temp: 97.5 F (36.4 C)    Physical Examination:   General appearance - alert, well appearing, and in no distress Ears - bilateral TM's and external ear canals normal Nose - erythematous and congested nasal turbinates bilaterally, no sinus tenderness Mouth - mucous membranes moist, pharynx normal without lesions, upper left and right molars with prior fillings. No abscess or swelling.  Neck - supple, no significant adenopathy Chest - clear to auscultation, no wheezes, rales or rhonchi, symmetric air entry Skin - brownish discoloration in between breasts

## 2013-05-29 NOTE — Assessment & Plan Note (Signed)
-   start flonase and cetirizine - follow up if fever, purulent discharge or worsening symptoms

## 2013-05-29 NOTE — Patient Instructions (Signed)
Allergic Rhinitis Allergic rhinitis is when the mucous membranes in the nose respond to allergens. Allergens are particles in the air that cause your body to have an allergic reaction. This causes you to release allergic antibodies. Through a chain of events, these eventually cause you to release histamine into the blood stream. Although meant to protect the body, it is this release of histamine that causes your discomfort, such as frequent sneezing, congestion, and an itchy, runny nose.  CAUSES  Seasonal allergic rhinitis (hay fever) is caused by pollen allergens that may come from grasses, trees, and weeds. Year-round allergic rhinitis (perennial allergic rhinitis) is caused by allergens such as house dust mites, pet dander, and mold spores.  SYMPTOMS   Nasal stuffiness (congestion).  Itchy, runny nose with sneezing and tearing of the eyes. DIAGNOSIS  Your health care provider can help you determine the allergen or allergens that trigger your symptoms. If you and your health care provider are unable to determine the allergen, skin or blood testing may be used. TREATMENT  Allergic Rhinitis does not have a cure, but it can be controlled by:  Medicines and allergy shots (immunotherapy).  Avoiding the allergen. Hay fever may often be treated with antihistamines in pill or nasal spray forms. Antihistamines block the effects of histamine. There are over-the-counter medicines that may help with nasal congestion and swelling around the eyes. Check with your health care provider before taking or giving this medicine.  If avoiding the allergen or the medicine prescribed do not work, there are many new medicines your health care provider can prescribe. Stronger medicine may be used if initial measures are ineffective. Desensitizing injections can be used if medicine and avoidance does not work. Desensitization is when a patient is given ongoing shots until the body becomes less sensitive to the allergen.  Make sure you follow up with your health care provider if problems continue. HOME CARE INSTRUCTIONS It is not possible to completely avoid allergens, but you can reduce your symptoms by taking steps to limit your exposure to them. It helps to know exactly what you are allergic to so that you can avoid your specific triggers. SEEK MEDICAL CARE IF:   You have a fever.  You develop a cough that does not stop easily (persistent).  You have shortness of breath.  You start wheezing.  Symptoms interfere with normal daily activities. Document Released: 10/20/2000 Document Revised: 11/15/2012 Document Reviewed: 10/02/2012 [employer] Patient Information 2014 [employer].  

## 2013-05-29 NOTE — Assessment & Plan Note (Signed)
Explained that dentist referral through orange card may not go through given non urgent matter She will likely need to go to dentist and pay out of pocket.

## 2013-06-18 ENCOUNTER — Telehealth: Payer: Self-pay | Admitting: Family Medicine

## 2013-06-18 MED ORDER — NYSTATIN 100000 UNIT/GM EX CREA: 1.0000 "application " | TOPICAL_CREAM | Freq: Two times a day (BID) | CUTANEOUS | Status: DC

## 2013-06-18 NOTE — Telephone Encounter (Signed)
Sent cream instead of powder. Please let patient know.   Thank you!  Marena ChancyStephanie Leva Baine, PGY-3 Family Medicine Resident

## 2013-06-18 NOTE — Telephone Encounter (Signed)
It appears patient was given a powder at last OV, she is requesting cream instead.  Will fwd to MD.  Radene OuKristen L Kayce Betty, CMA

## 2013-06-18 NOTE — Telephone Encounter (Signed)
Pt called and would like the doctor to change the Nystatin to a cream because it is so much cheaper. jw

## 2013-06-19 NOTE — Telephone Encounter (Signed)
Attempted to call patient, she does not have voicemail. If she returns call please read message below from Dr Gwenlyn SaranLosq.Doris Cheadleobert L Ghislaine Harcum

## 2013-12-10 ENCOUNTER — Encounter: Payer: Self-pay | Admitting: Family Medicine

## 2014-01-15 ENCOUNTER — Telehealth: Payer: Self-pay | Admitting: Family Medicine

## 2014-01-15 NOTE — Telephone Encounter (Signed)
Pt called and needs a refill on her BC called in. jw °

## 2014-01-16 MED ORDER — NORGESTIMATE-ETH ESTRADIOL 0.25-35 MG-MCG PO TABS: 1.0000 | ORAL_TABLET | Freq: Every day | ORAL | Status: DC

## 2014-01-16 NOTE — Telephone Encounter (Signed)
Refill sent to pharmacy. Please inform patient.

## 2014-02-28 ENCOUNTER — Ambulatory Visit: Payer: Self-pay

## 2015-01-06 ENCOUNTER — Other Ambulatory Visit: Payer: Self-pay | Admitting: Family Medicine

## 2015-01-06 DIAGNOSIS — Z3041 Encounter for surveillance of contraceptive pills: Secondary | ICD-10-CM

## 2016-01-05 ENCOUNTER — Other Ambulatory Visit: Payer: Self-pay | Admitting: *Deleted

## 2016-01-05 DIAGNOSIS — Z3041 Encounter for surveillance of contraceptive pills: Secondary | ICD-10-CM

## 2016-01-06 MED ORDER — NORGESTIMATE-ETH ESTRADIOL 0.25-35 MG-MCG PO TABS: 1.0000 | ORAL_TABLET | Freq: Every day | ORAL | 0 refills | Status: DC

## 2016-01-06 NOTE — Telephone Encounter (Signed)
It appears as though patient has not been physically seen in our clinic for >7543yrs. I do not feel comfortable filling this medication without having her rechecked first. Patient is also due for a Pap.  I will refill 1 month of OCP. Only once. Patient will need to make an appt with me before I write for any additional Rxs.

## 2016-02-03 ENCOUNTER — Other Ambulatory Visit: Payer: Self-pay | Admitting: Family Medicine

## 2016-02-03 DIAGNOSIS — Z3041 Encounter for surveillance of contraceptive pills: Secondary | ICD-10-CM

## 2016-02-04 NOTE — Telephone Encounter (Signed)
Patient hasn't been seen in >1157yrs. She needs an appt annually (at least) for any refills.

## 2016-02-07 ENCOUNTER — Other Ambulatory Visit: Payer: Self-pay | Admitting: Family Medicine

## 2016-02-07 DIAGNOSIS — Z3041 Encounter for surveillance of contraceptive pills: Secondary | ICD-10-CM

## 2016-02-11 NOTE — Telephone Encounter (Signed)
Pt is calling for a refill on her BC pills to be called in. jw

## 2016-02-12 ENCOUNTER — Other Ambulatory Visit: Payer: Self-pay | Admitting: Family Medicine

## 2016-02-12 DIAGNOSIS — Z309 Encounter for contraceptive management, unspecified: Secondary | ICD-10-CM

## 2016-02-12 NOTE — Telephone Encounter (Signed)
1 month supply provided. Absolutely no more Rxs will be filled w/o appt.

## 2016-02-12 NOTE — Telephone Encounter (Signed)
Actually, I am mistaken. Patient needs (negative) pregnancy test on file.   I apologize but that issue makes this a firm "No". I am sorry for the inconvenience to this patient.

## 2016-02-12 NOTE — Telephone Encounter (Signed)
If patient can afford to come in for pregnancy test only then I would be willing to refill for 1 month (ONLY).

## 2016-02-12 NOTE — Telephone Encounter (Signed)
Pt has no insurance and can not afford to come in at this time. Pt would like a refill on BC, just one month supply until she can afford to come in next month. Pt uses [facility] on [address]. ep

## 2016-04-20 ENCOUNTER — Telehealth (HOSPITAL_COMMUNITY): Payer: Self-pay | Admitting: *Deleted

## 2016-04-20 ENCOUNTER — Encounter (HOSPITAL_COMMUNITY): Payer: Self-pay | Admitting: *Deleted

## 2016-04-20 NOTE — Telephone Encounter (Signed)
Telephoned patient at home number and unable to leave message. Used interpreter Delorise RoyalsJulie Sowell.

## 2016-05-06 ENCOUNTER — Encounter (HOSPITAL_COMMUNITY): Payer: Self-pay

## 2016-05-06 ENCOUNTER — Ambulatory Visit (HOSPITAL_COMMUNITY)
Admission: RE | Admit: 2016-05-06 | Discharge: 2016-05-06 | Disposition: A | Discharge status: Home / Self Care | Payer: Self-pay | Source: Ambulatory Visit | Attending: Obstetrics and Gynecology | Admitting: Obstetrics and Gynecology

## 2016-05-06 VITALS — BP 126/70 | Temp 98.3°F | Ht 65.0 in | Wt 186.0 lb

## 2016-05-06 DIAGNOSIS — Z1239 Encounter for other screening for malignant neoplasm of breast: Secondary | ICD-10-CM

## 2016-05-06 DIAGNOSIS — R87612 Low grade squamous intraepithelial lesion on cytologic smear of cervix (LGSIL): Secondary | ICD-10-CM

## 2016-05-06 NOTE — Patient Instructions (Signed)
Explained breast self awareness with Rebecca Serrano. Patient did not need a Pap smear today due to last Pap smear was 04/05/2016. Discussed the colposcopy the recommended follow up for her abnormal Pap smear. [facility]. Appointment scheduled for Monday, May 10, 2016 at 0920. Patient aware of appointment and will be there. Let patient know she will need a screening mammogram at age 89 unless clinically indicated prior. Rebecca Serrano verbalized understanding.  [name], [name], RN 10:52 AM

## 2016-05-06 NOTE — Progress Notes (Signed)
[facility] due to having an abnormal Pap smear 04/05/2016 that a colposcopy is recommended for follow up.  Pap Smear: Pap smear not completed today. [facility] with cells suspicious of high grade. [facility] for a colps[facility]. Appointment scheduled for Monday, May 10, 2016 at 0920. Per patient has no history of abnormal Pap smears prior to the most recent Pap smear. Last pap smear result is in EPIC.  Physical exam: Breasts Breasts symmetrical. No skin abnormalities bilateral breasts. No nipple retraction bilateral breasts. No nipple discharge bilateral breasts. No lymphadenopathy. No lumps palpated bilateral breasts. No complaints of pain or tenderness on exam. Screening mammogram recommended at age [age] unless clinically indicated prior.    Pelvic/Bimanual No Pap smear completed today since last Pap smear was 04/05/2016. Pap smear not indicated per [facility] guidelines.   Smoking History: Patient has never smoked.  Patient Navigation: Patient education provided. Access to services provided for [facility] through [facility].

## 2016-05-07 ENCOUNTER — Encounter (HOSPITAL_COMMUNITY): Payer: Self-pay | Admitting: *Deleted

## 2016-05-10 ENCOUNTER — Other Ambulatory Visit (HOSPITAL_COMMUNITY)
Admission: RE | Admit: 2016-05-10 | Discharge: 2016-05-10 | Disposition: A | Discharge status: Home / Self Care | Payer: Self-pay | Source: Ambulatory Visit | Attending: Obstetrics & Gynecology | Admitting: Obstetrics & Gynecology

## 2016-05-10 ENCOUNTER — Encounter (INDEPENDENT_AMBULATORY_CARE_PROVIDER_SITE_OTHER): Payer: Self-pay

## 2016-05-10 ENCOUNTER — Encounter: Payer: Self-pay | Admitting: Obstetrics & Gynecology

## 2016-05-10 ENCOUNTER — Ambulatory Visit (INDEPENDENT_AMBULATORY_CARE_PROVIDER_SITE_OTHER): Payer: Self-pay | Admitting: Obstetrics & Gynecology

## 2016-05-10 VITALS — BP 126/83 | HR 73 | Wt 182.6 lb

## 2016-05-10 DIAGNOSIS — N879 Dysplasia of cervix uteri, unspecified: Secondary | ICD-10-CM

## 2016-05-10 DIAGNOSIS — R87612 Low grade squamous intraepithelial lesion on cytologic smear of cervix (LGSIL): Secondary | ICD-10-CM | POA: Insufficient documentation

## 2016-05-10 LAB — POCT PREGNANCY, URINE: PREG TEST UR: NEGATIVE

## 2016-05-10 NOTE — Addendum Note (Signed)
Addended by: Faythe Casa on: 05/10/2016 02:06 PM   Modules accepted: Orders

## 2016-05-10 NOTE — Patient Instructions (Signed)
Colposcopy, Care After This sheet gives you information about how to care for yourself after your procedure. Your doctor may also give you more specific instructions. If you have problems or questions, contact your doctor. What can I expect after the procedure? If you did not have a tissue sample removed (did not have a biopsy), you may only have some spotting for a few days. You can go back to your normal activities. If you had a tissue sample removed, it is common to have:  Soreness and pain. This may last for a few days.  Light-headedness.  Mild bleeding from your vagina or dark-colored, grainy discharge from your vagina. This may last for a few days. You may need to wear a sanitary pad.  Spotting for at least 48 hours after the procedure. Follow these instructions at home:  Take over-the-counter and prescription medicines only as told by your doctor. Ask your doctor what medicines you can start taking again. This is very important if you take blood-thinning medicine.  Do not drive or use heavy machinery while taking prescription pain medicine.  For 3 days, or as long as your doctor tells you, avoid:  Douching.  Using tampons.  Having sex.  If you use birth control (contraception), keep using it.  Limit activity for the first day after the procedure. Ask your doctor what activities are safe for you.  It is up to you to get the results of your procedure. Ask your doctor when your results will be ready.  Keep all follow-up visits as told by your doctor. This is important. Contact a doctor if:  You get a skin rash. Get help right away if:  You are bleeding a lot from your vagina. It is a lot of bleeding if you are using more than one pad an hour for 2 hours in a row.  You have clumps of blood (blood clots) coming from your vagina.  You have a fever.  You have chills  You have pain in your lower belly (pelvic area).  You have signs of infection, such as vaginal  discharge that is:  Different than usual.  Yellow.  Bad-smelling.  You have very pain or cramps in your lower belly that do not get better with medicine.  You feel light-headed.  You feel dizzy.  You pass out (faint). Summary  If you did not have a tissue sample removed (did not have a biopsy), you may only have some spotting for a few days. You can go back to your normal activities.  If you had a tissue sample removed, it is common to have mild pain and spotting for 48 hours.  For 3 days, or as long as your doctor tells you, avoid douching, using tampons and having sex.  Get help right away if you have bleeding, very bad pain, or signs of infection. This information is not intended to replace advice given to you by your health care provider. Make sure you discuss any questions you have with your health care provider. Document Released: 07/14/2007 Document Revised: 10/15/2015 Document Reviewed: 10/15/2015 Elsevier Interactive Patient Education  2017 Elsevier Inc[employer]  

## 2016-05-10 NOTE — Progress Notes (Signed)
Colposcopy Procedure Note  Indications: Pap smear 2 months ago showed: low-grade squamous intraepithelial neoplasia (LGSIL - encompassing HPV,mild dysplasia,CIN I). The prior pap showed no abnormalities.  Prior cervical/vaginal disease: normal exam without visible pathology. Prior cervical treatment: no treatment.  Procedure Details  The risks and benefits of the procedure and Written informed consent obtained.  Speculum placed in vagina and excellent visualization of cervix achieved, cervix swabbed x 3 with acetic acid solution.  Findings: Cervix: acetowhite lesion(s) noted at 12,6,9 o'clock; cervix swabbed with Lugol's solution, SCJ visualized - lesion at 12,6,9 o'clock, endocervical curettage performed, endometrial biopsy performed and hemostasis achieved with Monsel's solution. Vaginal inspection: vaginal colposcopy not performed. Vulvar colposcopy: vulvar colposcopy not performed.  Specimens: 12,6,9 and ECC  Complications: bleeding.  Vagina packed for 10 minutes, no bleeding after packing removed. Plan: Specimens labelled and sent to Pathology. Will base further treatment on Pathology findings.

## 2016-05-17 ENCOUNTER — Encounter: Payer: Self-pay | Admitting: Obstetrics & Gynecology

## 2016-05-17 DIAGNOSIS — N870 Mild cervical dysplasia: Secondary | ICD-10-CM | POA: Insufficient documentation

## 2016-05-24 ENCOUNTER — Encounter: Payer: Self-pay | Admitting: Family Medicine

## 2016-05-24 ENCOUNTER — Telehealth: Payer: Self-pay

## 2016-05-24 NOTE — Telephone Encounter (Signed)
Spoke with patient regarding results of CIN 1 and need for co-testing in a year. Patient voice understanding.

## 2016-05-26 ENCOUNTER — Telehealth: Payer: Self-pay | Admitting: *Deleted

## 2016-05-26 NOTE — Telephone Encounter (Signed)
Called patient stating I am returning her phone call. Patient states that she wants to know why she doesn't require further treatment and why they did the colposcopy. Explained to patient that pap smears don't give [place] the full picture of what's happening on the cervix because it merely brushes up against cells whereas the colposcopy biopsies actually tell us what's going on beneath the surface. Patient verbalized understanding and asked about getting the HPV shots. Told patient that she isn't eligible past 26. Patient asked if she could get them anyway. Told patient the usage isn't recommended past 26 and she would have to pay out of pocket for them anyway. Patient verbalized understanding and asked if we sent letters in the mail as a reminder for her pap smear. Told patient we do not. Patient verbalized understanding & had no questions

## 2016-05-26 NOTE — Telephone Encounter (Signed)
Patient left message, looking for test results. Please call.

## 2017-03-09 DIAGNOSIS — B977 Papillomavirus as the cause of diseases classified elsewhere: Secondary | ICD-10-CM | POA: Insufficient documentation

## 2018-09-13 ENCOUNTER — Other Ambulatory Visit: Payer: Self-pay

## 2018-09-13 DIAGNOSIS — Z20822 Contact with and (suspected) exposure to covid-19: Secondary | ICD-10-CM

## 2018-09-14 LAB — NOVEL CORONAVIRUS, NAA: SARS-CoV-2, NAA: NOT DETECTED

## 2019-03-15 NOTE — Progress Notes (Signed)
Subjective:    Patient ID: Rebecca Serrano, female    DOB: May 18, 1985, 34 y.o.   MRN: [id number]   CC: Establishing care (previous patient, has not been seen in about 4 years)  HPI: Rebecca Serrano is a 34 year old female with a history of CIN-1 presenting to establish care and discuss the following:  Foot pain: Left great toe, past two months.  States it is mainly bothering her on the lateral edge where her nail goes in.  She tried cutting down her nail a lot whenever it hurts.  Sometimes will get a little puffy on the side of her toe.  Daughter steps on it a lot.  Nail thickened.  Denies any fever, erythema, drainage from the region.  Past medical history:  CIN-1: Colposcopy in 05/2016 showing CIN-1 after abnormal Pap, recommended cotesting in 1 year.  States that she had a Pap smear at the [facility] 1-2 weeks ago, has not heard back yet.   Seasonal allergies  G2 P2, 1 vaginal, 1 C-section  Past surgical history: C-section in 2006  Social history: Lives with her mother and children (39 yo and 86 yo).  Non-smoker, does not drink alcohol.  Denies any illicit drug use.  Does not exercise on a regular basis.  Uses Sprintec for contraception.  Feels anxious sometimes, relates to worrying about her health.  Health maintenance: Up-to-date, will obtain Pap smear records.  Objective:  BP 122/84   Pulse 93   Wt 198 lb 12.8 oz (90.2 kg)   SpO2 99%   BMI 33.08 kg/m  Vitals and nursing note reviewed  General: NAD, pleasant HEENT: NCAT, MMM Cardiac: RRR, normal heart sounds Respiratory: CTAB, normal effort Abdomen: soft, nontender, nondistended Extremities: no edema or cyanosis.  Palpable dorsalis pedis pulses bilaterally.  Left great toenail thickened with mild yellow discoloration, lateral nail plate substantially cut down, appears to be hitting into the lateral nail bed.  No erythema, drainage, or swelling noted to nail bed.  Some tenderness with palpation of lateral great toe.   Otherwise no deformity noted to feet bilaterally.  Skin: warm and dry, no rashes noted Psych: normal affect  Assessment & Plan:   Encounter to establish care Reviewed past medical, social, medications, and surgical history with patient.  Reviewed health maintenance, currently UTD.  Will obtain records from [facility] for Pap smear, release signed today.  Dysplasia of cervix, low grade (CIN 1) Diagnosed on colpo in 2018, reports recent Pap in 02/2019.  Obtaining records as discussed above.  Ingrown toenail of left foot Mild, several month history of discomfort.  Associated with significantly thickened nail likely in the setting of repetitive trauma rather than onychomycosis.  Discussed proper nail cutting technique, appropriate shoe wear, and soaking her toe in Epsom salt warm water baths/Q-tip elevation over time.  If she continues to experience discomfort or develops subsequent infection, discuss may need to have her nail partially removed.  Weight gain At the end of the visit, patient endorsed frustrations of slow weight gain over the last 1-2 years despite modifying her diet.  198lb today, 186 in 2018. She would like her thyroid level checked, will obtain TSH.  Briefly discussed dietary modifications and increasing physical activity in hopes of weight reduction, can discuss further on follow-up.   Did not get to address patient concern for anxiety during today's visit, recommended following up in the next several weeks to discuss this.  Denies any SI/HI, feels her anxiety revolves around her health.  Leticia Penna DO Family Medicine Resident PGY-2

## 2019-03-16 ENCOUNTER — Ambulatory Visit (INDEPENDENT_AMBULATORY_CARE_PROVIDER_SITE_OTHER): Payer: Self-pay | Admitting: Family Medicine

## 2019-03-16 ENCOUNTER — Encounter: Payer: Self-pay | Admitting: Family Medicine

## 2019-03-16 ENCOUNTER — Other Ambulatory Visit: Payer: Self-pay

## 2019-03-16 VITALS — BP 122/84 | HR 93 | Wt 198.8 lb

## 2019-03-16 DIAGNOSIS — Z7689 Persons encountering health services in other specified circumstances: Secondary | ICD-10-CM

## 2019-03-16 DIAGNOSIS — L600 Ingrowing nail: Secondary | ICD-10-CM

## 2019-03-16 DIAGNOSIS — N870 Mild cervical dysplasia: Secondary | ICD-10-CM

## 2019-03-16 DIAGNOSIS — R635 Abnormal weight gain: Secondary | ICD-10-CM

## 2019-03-16 NOTE — Patient Instructions (Signed)
It was so wonderful to meet you today.  For your toe I recommend that you cut the nail straight across rather than down, you can also soak your foot in Epson salt warm water bath for 20 minutes at a time several times a day to help soften this area, and avoid hard close toed shoes as often as she can.  Depending on how your toe does, we could consider sending you to podiatry for toenail removal.   I would like to see you back at your convenience to discuss anxiety.

## 2019-03-17 ENCOUNTER — Encounter: Payer: Self-pay | Admitting: Family Medicine

## 2019-03-17 DIAGNOSIS — L600 Ingrowing nail: Secondary | ICD-10-CM | POA: Insufficient documentation

## 2019-03-17 DIAGNOSIS — R635 Abnormal weight gain: Secondary | ICD-10-CM | POA: Insufficient documentation

## 2019-03-17 DIAGNOSIS — Z7689 Persons encountering health services in other specified circumstances: Secondary | ICD-10-CM | POA: Insufficient documentation

## 2019-03-17 LAB — TSH: TSH: 1.23 u[IU]/mL (ref 0.450–4.500)

## 2019-03-17 NOTE — Assessment & Plan Note (Signed)
At the end of the visit, patient endorsed frustrations of slow weight gain over the last 1-2 years despite modifying her diet.  198lb today, 186 in 2018. She would like her thyroid level checked, will obtain TSH.  Briefly discussed dietary modifications and increasing physical activity in hopes of weight reduction, can discuss further on follow-up.

## 2019-03-17 NOTE — Assessment & Plan Note (Addendum)
Diagnosed on colpo in 2018, reports recent Pap in 02/2019.  Obtaining records as discussed above.

## 2019-03-17 NOTE — Assessment & Plan Note (Signed)
Reviewed past medical, social, medications, and surgical history with patient.  Reviewed health maintenance, currently UTD.  Will obtain records from [facility] department for Pap smear, release signed today.

## 2019-03-17 NOTE — Assessment & Plan Note (Signed)
Mild, several month history of discomfort.  Associated with significantly thickened nail likely in the setting of repetitive trauma rather than onychomycosis.  Discussed proper nail cutting technique, appropriate shoe wear, and soaking her toe in Epsom salt warm water baths/Q-tip elevation over time.  If she continues to experience discomfort or develops subsequent infection, discuss may need to have her nail partially removed.

## 2019-12-05 ENCOUNTER — Encounter: Payer: Self-pay | Admitting: Family Medicine

## 2019-12-05 ENCOUNTER — Ambulatory Visit (INDEPENDENT_AMBULATORY_CARE_PROVIDER_SITE_OTHER): Payer: Self-pay | Admitting: Family Medicine

## 2019-12-05 ENCOUNTER — Other Ambulatory Visit: Payer: Self-pay

## 2019-12-05 VITALS — BP 122/80 | HR 84 | Ht 63.0 in | Wt 193.8 lb

## 2019-12-05 DIAGNOSIS — R102 Pelvic and perineal pain: Secondary | ICD-10-CM

## 2019-12-05 LAB — POCT URINALYSIS DIP (MANUAL ENTRY)
Bilirubin, UA: NEGATIVE
Glucose, UA: NEGATIVE mg/dL
Ketones, POC UA: NEGATIVE mg/dL
Leukocytes, UA: NEGATIVE
Nitrite, UA: NEGATIVE
Protein Ur, POC: NEGATIVE mg/dL
Spec Grav, UA: >=1.03 — AB (ref 1.010–1.025)
Urobilinogen, UA: 0.2 E.U./dL
pH, UA: 7.5 (ref 5.0–8.0)

## 2019-12-05 LAB — POCT UA - MICROSCOPIC ONLY

## 2019-12-05 MED ORDER — SULFAMETHOXAZOLE-TRIMETHOPRIM 800-160 MG PO TABS: 1.0000 | ORAL_TABLET | Freq: Two times a day (BID) | ORAL | 0 refills | Status: DC

## 2019-12-05 NOTE — Patient Instructions (Signed)
Lets try one week of antibiotic therapy and have you seen next week if you are not better.

## 2019-12-05 NOTE — Progress Notes (Signed)
    CHIEF COMPLAINT / HPI: Discomfort in the GU area.  Describes it mostly as pelvic pressure or "something feeling like it is not right".  Feels more pressure when she is she is seated.  When she stands up sometimes she feels like she has leakage of urine.  This has been going on since Sunday, 3 days ago.  She also had some similar symptoms off and on throughout the last month and had an episode of painful intercourse.  Her menses are usually regular and her LMP was October 14.  Cycle length was 4 days which is typical only the flow was a little lighter.  She seen no vaginal bleeding since then.  No burning on urination but she has had increased frequency of urination.   PERTINENT  PMH / PSH: I have reviewed the patient's medications, allergies, past medical and surgical history, smoking status and updated in the EMR as appropriate.  No personal history of diabetes mellitus. G2, P2 OBJECTIVE:  BP 122/80   Pulse 84   Ht 5\' 3"  (1.6 m)   Wt 193 lb 12.8 oz (87.9 kg)   LMP 11/22/2019 (Exact Date)   SpO2 97%   BMI 34.33 kg/m  GENERAL: Well-developed female no acute distress next ABDOMEN: Soft, positive bowel sounds next GU: Externally normal.  The external opening of the urethra is normal in appearance and position.  There is very slight laxity of the posterior bladder wall but no definite cystocele.  She has no distention of cervix or uterus with Valsalva.  The vaginal mucosa is normal.  Bimanual exam is normal.  Urinalysis positive for hemoglobin on dipstick and microscopic exam showed 0-3 red blood cells with a specific gravity of 1.030.  Urine culture is pending.  ASSESSMENT / PLAN: Pelvic pressure and mildly abnormal urinalysis.  Her symptoms sounded like potential prolapse but I do not see that on exam.  She has some history of urinary incontinence and her exam is consistent with a little bit of laxity of the posterior bladder wall but no cystocele is apparent.  Will empirically treat for  UTI given her mildly abnormal UA and her symptoms of increased frequency.  If she is does not have complete resolution of her symptoms after 7 days of antibiotic treatment, she will make an appointment to see 11/24/2019 back.  I will also culture her urine and let her know if the antibiotic choice is inappropriate.  I offered to go ahead make her a follow-up appointment and she opted to just see how she does and then call if she needs another one.  No problem-specific Assessment & Plan notes found for this encounter.   [place] MD

## 2019-12-07 LAB — URINE CULTURE

## 2020-02-09 NOTE — L&D Delivery Note (Signed)
OB/GYN Faculty Practice Delivery Note  Rebecca Serrano is a 35 y.o. [phone] s/p VD/VBAC at [redacted]w[redacted]d. She was admitted for IOL due to cHTN.   ROM: 16h 2m with clear fluid GBS Status:  Negative/-- (09/07 1333) Maximum Maternal Temperature: 98.83F  Labor Progress: Initial SVE: 1.5/thick. She then progressed to complete.   Delivery Date/Time: 11/07/2020, 2233 Delivery: Called to room and patient was complete and pushing. Fetal HR sustained in 90's while crowning, delivery call made. Called Dr. Para March to consider vacuum, however on next two pushes infant was delivered with spontaneous cry. Head delivered L OA. Loose nuchal cord present and delivered through. Shoulder and body delivered in usual fashion. Infant with spontaneous cry, placed on mother's abdomen, dried and stimulated. Cord clamped x 2 after 1-minute delay, and cut by FOB. Cord blood drawn. Placenta delivered spontaneously with gentle cord traction. Fundus firm with massage and Pitocin. Labia, perineum, vagina, and cervix inspected inspected with a small first degree perineal that was oozing despite pressure, repaired with figure of eight stitch and then hemostatic.  Baby Weight: pending  Placenta: Sent to L&D Complications: None Lacerations: 1st degree perineal  EBL: 203 mL Analgesia: Epidural   Infant:  APGAR (1 MIN): 8   APGAR (5 MINS): 9   APGAR (10 MINS):     Leticia Penna, DO  OB Family Medicine Fellow, [facility] for [facility] 11/07/2020, 11:12 PM

## 2020-03-27 ENCOUNTER — Encounter (HOSPITAL_COMMUNITY): Payer: Self-pay | Admitting: Family Medicine

## 2020-03-27 ENCOUNTER — Other Ambulatory Visit: Payer: Self-pay

## 2020-03-27 ENCOUNTER — Inpatient Hospital Stay (HOSPITAL_COMMUNITY)
Admission: AD | Admit: 2020-03-27 | Discharge: 2020-03-27 | Disposition: A | Discharge status: Home Health | Payer: Self-pay | Attending: Family Medicine | Admitting: Family Medicine

## 2020-03-27 DIAGNOSIS — O209 Hemorrhage in early pregnancy, unspecified: Secondary | ICD-10-CM | POA: Insufficient documentation

## 2020-03-27 DIAGNOSIS — O4691 Antepartum hemorrhage, unspecified, first trimester: Secondary | ICD-10-CM

## 2020-03-27 DIAGNOSIS — Z3A01 Less than 8 weeks gestation of pregnancy: Secondary | ICD-10-CM

## 2020-03-27 HISTORY — DX: Other specified health status: Z78.9

## 2020-03-27 LAB — POCT PREGNANCY, URINE: Preg Test, Ur: POSITIVE — AB

## 2020-03-27 NOTE — MAU Provider Note (Signed)
History     [id number]  Arrival date and time: 03/27/20 1805    Chief Complaint  Patient presents with  . Vaginal Bleeding     HPI Rebecca Serrano is a 35 y.o. at [redacted]w[redacted]d by firm LMP with PMHx notable for cesarean followed by VBAC, who presents for vaginal bleeding.   Patient reports she went to urinate earlier today and had bright red blood when she wiped Patient's last menstrual period was 02/13/2020. and she had a positive home urine pregnancy test two weeks ago Because of this she became very worried so she came to be evaluated She reports some mild intermittent pelvic cramping as well as low back pain which is similar to when she is about to get her period Denies loss of fluid or contractions Period was very regular prior to conceiving Pregnancy is unplanned but welcome Last intercourse was almost a week ago Nothing has made bleeding worse or better      OB History    Gravida  3   Para  2   Term  2   Preterm  0   AB  0   Living  2     SAB  0   IAB  0   Ectopic  0   Multiple  0   Live Births  2           Past Medical History:  Diagnosis Date  . Medical history non-contributory   . No pertinent past medical history     Past Surgical History:  Procedure Laterality Date  . CESAREAN SECTION      Family History  Problem Relation Age of Onset  . Diabetes Father   . Cancer Father   . Hyperlipidemia Father   . Drug abuse Paternal Aunt   . Drug abuse Paternal Uncle   . Early death Paternal Grandfather   . Other Neg Hx     Social History   Socioeconomic History  . Marital status: Single    Spouse name: Not on file  . Number of children: Not on file  . Years of education: Not on file  . Highest education level: Not on file  Occupational History  . Not on file  Tobacco Use  . Smoking status: Never Smoker  . Smokeless tobacco: Never Used  Substance and Sexual Activity  . Alcohol use: No  . Drug use: No  . Sexual activity: Yes     Comment: Was on OCP when got pregnant. Last intercourse 1 wk. ago  Other Topics Concern  . Not on file  Social History Narrative  . Not on file   Social Determinants of Health   Financial Resource Strain: Not on file  Food Insecurity: Not on file  Transportation Needs: Not on file  Physical Activity: Not on file  Stress: Not on file  Social Connections: Not on file  Intimate Partner Violence: Not on file    No Known Allergies  No current facility-administered medications on file prior to encounter.   Current Outpatient Medications on File Prior to Encounter  Medication Sig Dispense Refill  . acetaminophen (TYLENOL) 325 MG tablet Take 650 mg by mouth every 6 (six) hours as needed.    . norgestimate-ethinyl estradiol (SPRINTEC 28) 0.25-35 MG-MCG tablet Take 1 tablet by mouth daily. 28 tablet 0  . sulfamethoxazole-trimethoprim (BACTRIM DS) 800-160 MG tablet Take 1 tablet by mouth 2 (two) times daily. 14 tablet 0     ROS Pertinent positives and negative per HPI,  all others reviewed and negative  Physical Exam   BP 121/68 (BP Location: Right Arm)   Pulse 82   Temp 97.8 F (36.6 C)   Resp 18   Ht 5\' 1"  (1.549 m)   Wt 91.2 kg   LMP 02/13/2020   BMI 37.98 kg/m   Patient Vitals for the past 24 hrs:  BP Temp Pulse Resp Height Weight  03/27/20 2053 121/68 -- 82 -- -- --  03/27/20 1919 132/75 -- 84 -- -- --  03/27/20 1918 -- 97.8 F (36.6 C) -- 18 5\' 1"  (1.549 m) 91.2 kg    Physical Exam Vitals reviewed.  Constitutional:      General: She is not in acute distress.    Appearance: She is well-developed and well-nourished. She is not diaphoretic.  Eyes:     General: No scleral icterus. Pulmonary:     Effort: Pulmonary effort is normal. No respiratory distress.  Abdominal:     General: There is no distension.     Palpations: Abdomen is soft.     Tenderness: There is no abdominal tenderness. There is no guarding or rebound.  Musculoskeletal:        General: No edema.   Skin:    General: Skin is warm and dry.  Neurological:     Mental Status: She is alert.     Coordination: Coordination normal.  Psychiatric:        Mood and Affect: Mood and affect normal.      Cervical Exam    Bedside Ultrasound Pt informed that the ultrasound is considered a limited OB ultrasound and is not intended to be a complete ultrasound exam.  Patient also informed that the ultrasound is not being completed with the intent of assessing for fetal or placental anomalies or any pelvic abnormalities.  Explained that the purpose of today's ultrasound is to assess for  viability.  Patient acknowledges the purpose of the exam and the limitations of the study.    My interpretation: viable IUP with normal cardiac activity observed at 148 bpm   Labs Results for orders placed or performed during the hospital encounter of 03/27/20 (from the past 24 hour(s))  Pregnancy, urine POC     Status: Abnormal   Collection Time: 03/27/20  7:06 PM  Result Value Ref Range   Preg Test, Ur POSITIVE (A) NEGATIVE    Imaging No results found.  MAU Course  Procedures  Lab Orders     Pregnancy, urine POC No orders of the defined types were placed in this encounter.  Imaging Orders  No imaging studies ordered today    MDM moderate  Assessment and Plan  #Vaginal bleeding in pregnancy, first trimester Patient reassured with 03/29/20 findings of viable IUP, ruled out for ectopic. Blood type O+, rhogam not indicated. Discussed that 80-90% of pregnancies with 1st trimester bleeding go on to have a normal pregnancy. Reviewed expectations around bleeding, may stop or may continue. Discussed possible etiologies, with [facility] being the most likely. Discussed return precautions and encouraged her to establish prenatal care, given list of local providers.   #FWB Viable IUP with normal fetal cardiac activity seen on bedside ultrasound  Discharged to home in stable condition.   [place],  MD/MPH 03/27/20 8:55 PM  Allergies as of 03/27/2020   No Known Allergies     Medication List    STOP taking these medications   norgestimate-ethinyl estradiol 0.25-35 MG-MCG tablet Commonly known as: Sprintec 28   sulfamethoxazole-trimethoprim 800-160 MG  tablet Commonly known as: BACTRIM DS     TAKE these medications   acetaminophen 325 MG tablet Commonly known as: TYLENOL Take 650 mg by mouth every 6 (six) hours as needed.

## 2020-03-27 NOTE — Discharge Instructions (Signed)
[facility] Providers    [facility]      Phone: [phone]  [facility]    Phone: [phone]  [facility] [place]   Phone: [phone]  [facility] [place]   Phone: [phone]  [facility] [place]   Phone: [phone]  [facility]    Phone: [phone]  [facility]        Phone: [phone]  [facility]     Phone: [phone]   [facility]     Phone: [phone]  [facility]     Phone: [phone]  [facility]     Phone: [phone]  [facility]        Phone: [phone]   [facility]   Phone: [phone]  [facility]     Phone: [phone]  [facility]     Phone: [phone]  [facility]       Phone: [phone]  [facility]     Phone: [phone]   Vaginal Bleeding During Pregnancy, First Trimester A small amount of bleeding from the vagina is common during early pregnancy. This kind of bleeding is also called spotting. Sometimes the bleeding is normal and does not cause problems. At other times, though, bleeding may be a sign of something serious. Normal bleeding in pregnancy can happen:  When the fertilized egg attaches itself to your womb.  When blood vessels change because of the pregnancy.  When you have pelvic exams.  When you have sex. Abnormal bleeding can happen:  When you have an infection.  When you have growths in your womb. The growths are called polyps.  If you are having a miscarriage or at risk of having one.  If you have other problems in your pregnancy. Tell your doctor right away about any  bleeding from your vagina. Follow these instructions at home: Watch your bleeding  Watch your condition for any changes. Let your doctor know if you are worried about something.  Try to know what causes your bleeding. Ask yourself these questions: ? Does the bleeding start on its own? ? Does the bleeding start after something is done, such as sex or a pelvic exam?  Use a diary to write the things you see about your bleeding. Write in your diary: ? If the bleeding flows freely without stopping, or if it starts and stops, and then starts again. ? If the bleeding is heavy or light. ? How many pads you use in a day and how much blood is in them.  Tell your doctor if you pass tissue. He or she may want to see it.   Activity  Follow your doctor's instructions about how active you can be. Ask what activities are safe for you.  Do not have sex or orgasms until your doctor says that this is safe.  If needed, make plans for someone to help with your normal activities. General instructions  Take over-the-counter and prescription medicines only as told by your doctor.  Do not take aspirin because it can cause bleeding.  Do not use tampons.  Do not douche.  Keep all follow-up visits. Contact a doctor if:  You have vaginal bleeding at any time while you are pregnant.  You have cramps.  You have   a fever or chills. Get help right away if:  You have very bad cramps in your back or belly (abdomen).  You pass large clots or a lot of tissue from your vagina.  Your bleeding gets worse.  You feel light-headed.  You feel weak.  You pass out (faint).  You have chills.  You are leaking fluid from your vagina.  You have a gush of fluid from your vagina. Summary  Sometimes vaginal bleeding during pregnancy is normal and does not cause problems. At other times, bleeding may be a sign of something serious.  Tell your doctor right away about any bleeding from your vagina.  Follow  your doctor's instructions about how active you can be. You may need someone to help you with your normal activities.  Keep all follow-up visits. This information is not intended to replace advice given to you by your health care provider. Make sure you discuss any questions you have with your health care provider. Document Revised: 10/18/2019 Document Reviewed: 10/18/2019 Elsevier Patient Education  2021 [employer].

## 2020-03-27 NOTE — Progress Notes (Signed)
Dr Crissie Reese in earlier and did bedside u/s. IUP with FHTs noted. Written and verbal d/c instructions given and understanding voiced.

## 2020-03-27 NOTE — MAU Note (Signed)
Went to [facility] about an hour ago and when I wiped it was bright red initially and then pink the second time. Now having some pain in lower back like I am going to start my period. Did upt 2 wks ago and was positive. LMP 02/13/20

## 2020-04-06 NOTE — Progress Notes (Signed)
    SUBJECTIVE:   CHIEF COMPLAINT / HPI:   Confirm pregnancy: patient had positive pregnancy test in the hospital on 03/27/2020 (11 days ago). Patient had had bright red vaginal bleeding after using the bathroom. She was ultrasounded in the MAU which showed viable IUP, ruled out for ectopic. Mom denies any more vaginal bleeding since then. She is adopt-a-mom, will come to [place] for  prenatal care.  UTI: urinary frequency, urgency, dysuria. Lower abdomen discomfort. No fevers, body aches, chills.   PERTINENT  PMH / PSH:  Patient Active Problem List   Diagnosis Date Noted  . Supervision of normal pregnancy 04/08/2020  . Dysuria 04/08/2020  . Amenorrhea 04/07/2020  . Encounter to establish care 03/17/2019  . Ingrown toenail of left foot 03/17/2019  . Weight gain 03/17/2019  . Dysplasia of cervix, low grade (CIN 1) 05/17/2016  . Low grade squamous intraepithelial lesion (LGSIL) on Papanicolaou smear of cervix 05/10/2016  . Seasonal allergies 05/29/2013  . Stress incontinence 01/08/2013  . Obesity, unspecified 01/08/2013    OBJECTIVE:   BP 106/86   Pulse 87   Ht 5\' 1"  (1.549 m)   Wt 200 lb (90.7 kg)   LMP 02/13/2020   SpO2 98%   BMI 37.79 kg/m    Physical exam: General: well-appearing patient, no apparent distress Respiratory: CTA bilaterally, comfortable work of breathing Cardio: RRR, S1-S2 present, no murmurs appreciated Abdomen: Normal bowel sounds appreciated   ASSESSMENT/PLAN:   Supervision of normal pregnancy Per LMP patient is G3P2 at 7 weeks 6 days today.  Denies any vaginal bleeding since February 17. -Checking initial OB labs -Asked mom to schedule initial OB follow-up -Mom is adopt-a-Mom and has appointment with them on March 8 -Order placed/form filled out for dating ultrasound  Dysuria Patient experiencing dysuria for the last couple of days, also reports urinary urgency and frequency.  Denies fevers, body aches, chills. -Checking dipstick and performing OB  urine culture today -We will follow-up with results and treat as needed     08-09-1974, DO [facility]

## 2020-04-07 DIAGNOSIS — N912 Amenorrhea, unspecified: Secondary | ICD-10-CM | POA: Insufficient documentation

## 2020-04-08 ENCOUNTER — Other Ambulatory Visit: Payer: Self-pay

## 2020-04-08 ENCOUNTER — Encounter: Payer: Self-pay | Admitting: Family Medicine

## 2020-04-08 ENCOUNTER — Ambulatory Visit (INDEPENDENT_AMBULATORY_CARE_PROVIDER_SITE_OTHER): Payer: Self-pay | Admitting: Family Medicine

## 2020-04-08 VITALS — BP 106/86 | HR 87 | Ht 61.0 in | Wt 200.0 lb

## 2020-04-08 DIAGNOSIS — O0990 Supervision of high risk pregnancy, unspecified, unspecified trimester: Secondary | ICD-10-CM | POA: Insufficient documentation

## 2020-04-08 DIAGNOSIS — N912 Amenorrhea, unspecified: Secondary | ICD-10-CM

## 2020-04-08 DIAGNOSIS — R300 Dysuria: Secondary | ICD-10-CM | POA: Insufficient documentation

## 2020-04-08 DIAGNOSIS — Z3490 Encounter for supervision of normal pregnancy, unspecified, unspecified trimester: Secondary | ICD-10-CM | POA: Insufficient documentation

## 2020-04-08 DIAGNOSIS — Z3481 Encounter for supervision of other normal pregnancy, first trimester: Secondary | ICD-10-CM

## 2020-04-08 LAB — POCT URINALYSIS DIP (MANUAL ENTRY)
Bilirubin, UA: NEGATIVE
Glucose, UA: NEGATIVE mg/dL
Ketones, POC UA: NEGATIVE mg/dL
Leukocytes, UA: NEGATIVE
Nitrite, UA: NEGATIVE
Protein Ur, POC: NEGATIVE mg/dL
Spec Grav, UA: 1.025 (ref 1.010–1.025)
Urobilinogen, UA: 0.2 E.U./dL
pH, UA: 6.5 (ref 5.0–8.0)

## 2020-04-08 LAB — POCT UA - MICROSCOPIC ONLY

## 2020-04-08 LAB — POCT URINE PREGNANCY: Preg Test, Ur: POSITIVE — AB

## 2020-04-08 NOTE — Assessment & Plan Note (Signed)
Per LMP patient is G3P2 at 7 weeks 6 days today.  Denies any vaginal bleeding since February 17. -Checking initial OB labs -Asked mom to schedule initial OB follow-up -Mom is adopt-a-Mom and has appointment with them on March 8 -Order placed/form filled out for dating ultrasound

## 2020-04-08 NOTE — Assessment & Plan Note (Signed)
Patient experiencing dysuria for the last couple of days, also reports urinary urgency and frequency.  Denies fevers, body aches, chills. -Checking dipstick and performing OB urine culture today -We will follow-up with results and treat as needed

## 2020-04-08 NOTE — Patient Instructions (Addendum)
Thank you for coming in to see [place] today! Please see below to review our plan for today's visit:  1. We are checking OB labs for you.  2. Please come back at your earliest convenience to have your 1 hour initial OB visit.   Please call the clinic at [phone] if your symptoms worsen or you have any concerns. It was our pleasure to serve you!   Dr. Peggyann Shoals [facility]

## 2020-04-10 LAB — OBSTETRIC PANEL, INCLUDING HIV
Antibody Screen: NEGATIVE
Basophils Absolute: 0 10*3/uL (ref 0.0–0.2)
Basos: 0 %
EOS (ABSOLUTE): 0.1 10*3/uL (ref 0.0–0.4)
Eos: 1 %
HIV Screen 4th Generation wRfx: NONREACTIVE
Hematocrit: 40.2 % (ref 34.0–46.6)
Hemoglobin: 13.7 g/dL (ref 11.1–15.9)
Hepatitis B Surface Ag: NEGATIVE
Immature Grans (Abs): 0 10*3/uL (ref 0.0–0.1)
Immature Granulocytes: 0 %
Lymphocytes Absolute: 1.8 10*3/uL (ref 0.7–3.1)
Lymphs: 17 %
MCH: 30.8 pg (ref 26.6–33.0)
MCHC: 34.1 g/dL (ref 31.5–35.7)
MCV: 90 fL (ref 79–97)
Monocytes Absolute: 0.9 10*3/uL (ref 0.1–0.9)
Monocytes: 9 %
Neutrophils Absolute: 7.4 10*3/uL — ABNORMAL HIGH (ref 1.4–7.0)
Neutrophils: 73 %
Platelets: 341 10*3/uL (ref 150–450)
RBC: 4.45 x10E6/uL (ref 3.77–5.28)
RDW: 11.9 % (ref 11.7–15.4)
RPR Ser Ql: NONREACTIVE
Rh Factor: POSITIVE
Rubella Antibodies, IGG: 16.3 index (ref >0.99)
WBC: 10.2 10*3/uL (ref 3.4–10.8)

## 2020-04-10 LAB — HGB FRACTIONATION CASCADE
Hgb A2: 3 % (ref 1.8–3.2)
Hgb A: 97 % (ref 96.4–98.8)
Hgb F: 0 % (ref 0.0–2.0)
Hgb S: 0 %

## 2020-04-10 LAB — URINE CULTURE, OB REFLEX: Organism ID, Bacteria: NO GROWTH

## 2020-04-10 LAB — CULTURE, OB URINE

## 2020-04-10 LAB — HEPATITIS C ANTIBODY: Hep C Virus Ab: <0.1 s/co ratio (ref 0.0–0.9)

## 2020-04-17 NOTE — Progress Notes (Signed)
Patient Name: Rebecca Serrano Date of Birth: 23-Nov-1985 [facility] Initial Prenatal Visit  Rebecca Serrano is a 35 y.o. year old G3P2002 at [redacted]w[redacted]d who presents for her initial prenatal visit. Pregnancy is not planned She reports nausea, fatigue and bloating She is taking a prenatal vitamin.  She denies pelvic pain or vaginal bleeding.   Pregnancy Dating: . The patient is dated by ultrasound on 04/17/2020.  Marland Kitchen LMP: 02/13/2020 . Period is certain:  Yes.  . Periods were regular:  Yes.  Marland Kitchen LMP was a typical period:  Yes.  . Using hormonal contraception in 3 months prior to conception: Yes  Lab Review: . Blood type: O . Rh Status: + . Antibody screen: Negative . HIV: Negative . RPR: Negative . Hemoglobin electrophoresis reviewed: Yes . Results of OB urine culture are: Negative . Rubella: Immune . Hep C Ab: Negative . Varicella status is Unknown, although reports getting the virus as a child at 36 years old, recommend titer  PMH: Reviewed and as detailed below: . HTN: No  . Gestational Hypertension/preeclampsia: No  . Type 1 or 2 Diabetes: No  . Depression:  No  . Seizure disorder:  No . VTE: No ,  . History of STI No,  . Abnormal Pap smear:  No, . Genital herpes simplex:  No   PSH: . Gynecologic Surgery:  C-section 2006 with 1st pregnancy, VBAC 2nd pregnancy . Surgical history reviewed, notable for: none other than C-section  Obstetric History: . Obstetric history tab updated and reviewed.  . Summary of prior pregnancies: First pregnancy was C-section without complications. Second pregnancy VBAC without complications. . Cesarean delivery: Yes  . Gestational Diabetes:  No . Hypertension in pregnancy: No . History of preterm birth: No . History of LGA/SGA infant:  No . History of shoulder dystocia: No . Indications for referral were reviewed, and the patient has no obstetric indications for referral to [facility] at this time.   Social  History: . Partner's name: Chauncy Lean . Tobacco use: No . Alcohol use:  No . Other substance use:  No  Current Medications:  . Prenatal vitamin . Reviewed and appropriate in pregnancy.   Genetic and Infection Screen: . Flow Sheet Updated Yes  Prenatal Exam: Gen: Well nourished, well developed.  No distress.  Vitals noted. HEENT: Normocephalic, atraumatic.  Neck supple without cervical lymphadenopathy, thyromegaly or thyroid nodules.  Fair dentition. CV: RRR no murmur, gallops or rubs Lungs: CTA B.  Normal respiratory effort without wheezes or rales. Abd: soft, NTND. +BS.  Uterus not appreciated above pelvis. GU: Normal external female genitalia without lesions. No vaginal discharge.   Ext: No clubbing, cyanosis or edema. Psych: Normal grooming and dress.  Not depressed or anxious appearing.  Normal thought content and process without flight of ideas or looseness of associations  GU exam performed in the presence of a chaperone.   Assessment/Plan:  Rebecca Serrano is a 35 y.o. G3P2002 at [redacted]w[redacted]d who presents to initiate prenatal care. She is doing well.  Current pregnancy issues include none at this time..  1. Routine prenatal care: Marland Kitchen As dating is not reliable, a dating ultrasound has been ordered. Dating tab updated. . Pre-pregnancy weight updated. Expected weight gain this pregnancy is 11-20 pounds  . Prenatal labs reviewed, all appropriate. . Indications for referral to HROB were reviewed and the patient does not meet criteria for referral.  . Medication list reviewed and updated.  . Recommended patient see a  dentist for regular care.  . Bleeding and pain precautions reviewed. . Importance of prenatal vitamins reviewed.   . Genetic screening offered. Patient opted for: no screening. . The patient does not have an indication for aspirin therapy beginning at 12-16 weeks. Aspirin was not  recommended today.  . The patient will be age 15 or over at time of delivery. Referral to  genetic counseling was offered today.  . The patient has the following risk factors for preexisting diabetes: Reviewed indications for early 1 hour glucose testing, not indicated . An early 1 hour glucose tolerance test was not ordered. . Pregnancy Medical Home and PHQ-9 forms completed, problems noted: Yes  2. Pregnancy issues include the following which were addressed today:  . Patient already received both doses of the COVID vaccine at a prior time. . Influenza vaccine administered today. . After discussing with patient, she intends on another vaginal delivery. Given prior history of C-section, she will need to be seen by OB at 32 weeks for this. . Patient declined referral to MFM at this time.   Follow up 4 weeks for next prenatal visit.

## 2020-04-18 ENCOUNTER — Other Ambulatory Visit (HOSPITAL_COMMUNITY)
Admission: RE | Admit: 2020-04-18 | Discharge: 2020-04-18 | Disposition: A | Discharge status: Home / Self Care | Payer: Self-pay | Source: Ambulatory Visit | Attending: Family Medicine | Admitting: Family Medicine

## 2020-04-18 ENCOUNTER — Ambulatory Visit (INDEPENDENT_AMBULATORY_CARE_PROVIDER_SITE_OTHER): Payer: Self-pay | Admitting: Family Medicine

## 2020-04-18 ENCOUNTER — Other Ambulatory Visit: Payer: Self-pay

## 2020-04-18 VITALS — BP 122/60 | HR 91 | Wt 201.6 lb

## 2020-04-18 DIAGNOSIS — Z3491 Encounter for supervision of normal pregnancy, unspecified, first trimester: Secondary | ICD-10-CM | POA: Insufficient documentation

## 2020-04-18 DIAGNOSIS — Z3481 Encounter for supervision of other normal pregnancy, first trimester: Secondary | ICD-10-CM

## 2020-04-18 DIAGNOSIS — Z23 Encounter for immunization: Secondary | ICD-10-CM

## 2020-04-18 DIAGNOSIS — O3429 Maternal care due to uterine scar from other previous surgery: Secondary | ICD-10-CM

## 2020-04-18 DIAGNOSIS — Z3A09 9 weeks gestation of pregnancy: Secondary | ICD-10-CM

## 2020-04-18 NOTE — Patient Instructions (Addendum)
It was great seeing you today!  I am glad you are doing well thus far during your pregnancy! Today we performed a PAP, I will notify you of any abnormal results.   Go to the MAU at [facility] if:  You have pain in your lower abdomen or pelvic area  Your water breaks.  Sometimes it is a big gush of fluid, sometimes it is just a trickle that keeps getting your underwear wet or running down your legs  You have vaginal bleeding.   Please follow up at your next scheduled appointment in 4 weeks, if anything arises between now and then, please don't hesitate to contact our office.   Thank you for allowing [place] to be a part of your medical care!  Thank you, Dr. Robyne Peers   [email or web address]  First Trimester of Pregnancy  The first trimester of pregnancy starts on the first day of your last menstrual period until the end of week 12. This is also called months 1 through 3 of pregnancy. Body changes during your first trimester Your body goes through many changes during pregnancy. The changes usually return to normal after your baby is born. Physical changes  You may gain or lose weight.  Your breasts may grow larger and hurt. The area around your nipples may get darker.  Dark spots or blotches may develop on your face.  You may have changes in your hair. Health changes  You may feel like you might vomit (nauseous), and you may vomit.  You may have heartburn.  You may have headaches.  You may have trouble pooping (constipation).  Your gums may bleed. Other changes  You may get tired easily.  You may pee (urinate) more often.  Your menstrual periods will stop.  You may not feel hungry.  You may want to eat certain kinds of food.  You may have changes in your emotions from day to day.  You may have more dreams. Follow these instructions at home: Medicines  Take over-the-counter and prescription medicines only  as told by your doctor. Some medicines are not safe during pregnancy.  Take a prenatal vitamin that contains at least 600 micrograms (mcg) of folic acid. Eating and drinking  Eat healthy meals that include: ? Fresh fruits and vegetables. ? Whole grains. ? Good sources of protein, such as meat, eggs, or tofu. ? Low-fat dairy products.  Avoid raw meat and unpasteurized juice, milk, and cheese.  If you feel like you may vomit, or you vomit: ? Eat 4 or 5 small meals a day instead of 3 large meals. ? Try eating a few soda crackers. ? Drink liquids between meals instead of during meals.  You may need to take these actions to prevent or treat trouble pooping: ? Drink enough fluids to keep your pee (urine) pale yellow. ? Eat foods that are high in fiber. These include beans, whole grains, and fresh fruits and vegetables. ? Limit foods that are high in fat and sugar. These include fried or sweet foods. Activity  Exercise only as told by your doctor. Most people can do their usual exercise routine during pregnancy.  Stop exercising if you have cramps or pain in your lower belly (abdomen) or low back.  Do not exercise if it is too hot or too humid, or if you are in a place of great height (high altitude).  Avoid heavy lifting.  If you choose to, you may have sex unless your doctor tells  you not to. Relieving pain and discomfort  Wear a good support bra if your breasts are sore.  Rest with your legs raised (elevated) if you have leg cramps or low back pain.  If you have bulging veins (varicose veins) in your legs: ? Wear support hose as told by your doctor. ? Raise your feet for 15 minutes, 3-4 times a day. ? Limit salt in your food. Safety  Wear your seat belt at all times when you are in a car.  Talk with your doctor if someone is hurting you or yelling at you.  Talk with your doctor if you are feeling sad or have thoughts of hurting yourself. Lifestyle  Do not use hot tubs,  steam rooms, or saunas.  Do not douche. Do not use tampons or scented sanitary pads.  Do not use herbal medicines, illegal drugs, or medicines that are not approved by your doctor. Do not drink alcohol.  Do not smoke or use any products that contain nicotine or tobacco. If you need help quitting, ask your doctor.  Avoid cat litter boxes and soil that is used by cats. These carry germs that can cause harm to the baby and can cause a loss of your baby by miscarriage or stillbirth. General instructions  Keep all follow-up visits. This is important.  Ask for help if you need counseling or if you need help with nutrition. Your doctor can give you advice or tell you where to go for help.  Visit your dentist. At home, brush your teeth with a soft toothbrush. Floss gently.  Write down your questions. Take them to your prenatal visits. Where to find more information  American Pregnancy Association: americanpregnancy.org  [employer] of Obstetricians and Gynecologists: www.acog.org  Office on Women's Health: MightyReward.co.nz Contact a doctor if:  You are dizzy.  You have a fever.  You have mild cramps or pressure in your lower belly.  You have a nagging pain in your belly area.  You continue to feel like you may vomit, you vomit, or you have watery poop (diarrhea) for 24 hours or longer.  You have a bad-smelling fluid coming from your vagina.  You have pain when you pee.  You are exposed to a disease that spreads from person to person, such as chickenpox, measles, Zika virus, HIV, or hepatitis. Get help right away if:  You have spotting or bleeding from your vagina.  You have very bad belly cramping or pain.  You have shortness of breath or chest pain.  You have any kind of injury, such as from a fall or a car crash.  You have new or increased pain, swelling, or redness in an arm or leg. Summary  The first trimester of pregnancy starts on the first day of  your last menstrual period until the end of week 12 (months 1 through 3).  Eat 4 or 5 small meals a day instead of 3 large meals.  Do not smoke or use any products that contain nicotine or tobacco. If you need help quitting, ask your doctor.  Keep all follow-up visits. This information is not intended to replace advice given to you by your health care provider. Make sure you discuss any questions you have with your health care provider. Document Revised: 07/04/2019 Document Reviewed: 05/10/2019 Elsevier Patient Education  2021 [employer].

## 2020-04-20 LAB — CERVICOVAGINAL ANCILLARY ONLY
Chlamydia: NEGATIVE
Comment: NEGATIVE
Comment: NEGATIVE
Comment: NORMAL
Neisseria Gonorrhea: NEGATIVE
Trichomonas: NEGATIVE

## 2020-04-23 LAB — CYTOLOGY - PAP
Comment: NEGATIVE
Comment: NEGATIVE
Diagnosis: NEGATIVE
High risk HPV: NEGATIVE
Trichomonas: NEGATIVE

## 2020-04-27 ENCOUNTER — Encounter: Payer: Self-pay | Admitting: Family Medicine

## 2020-05-22 ENCOUNTER — Other Ambulatory Visit: Payer: Self-pay

## 2020-05-22 ENCOUNTER — Ambulatory Visit (INDEPENDENT_AMBULATORY_CARE_PROVIDER_SITE_OTHER): Payer: Self-pay | Admitting: Family Medicine

## 2020-05-22 VITALS — BP 120/80 | HR 96 | Wt 202.1 lb

## 2020-05-22 DIAGNOSIS — Z3A14 14 weeks gestation of pregnancy: Secondary | ICD-10-CM

## 2020-05-22 DIAGNOSIS — Z3490 Encounter for supervision of normal pregnancy, unspecified, unspecified trimester: Secondary | ICD-10-CM

## 2020-05-22 DIAGNOSIS — O3429 Maternal care due to uterine scar from other previous surgery: Secondary | ICD-10-CM

## 2020-05-22 LAB — POCT 1 HR PRENATAL GLUCOSE: Glucose 1 Hr Prenatal, POC: 158 mg/dL

## 2020-05-22 NOTE — Patient Instructions (Signed)
It was great seeing you today!  I am glad that your pregnancy is going well thus far! Today we did glucola testing, I will let you know of any abnormal results.  Go to the MA[facility] at [facility] if:  You have cramping/contractions that do not go away with drinking water  Your water breaks.  Sometimes it is a big gush of fluid, sometimes it is just a trickle that keeps getting your underwear wet or running down your legs  You have vaginal bleeding.     You do not feel your baby moving like normal.  If you do not, get something to eat and drink and lay down and focus on feeling your baby move. If your baby is still not moving like normal, you should go to MAU.   Please follow up in 4 weeks for your next scheduled appointment, if anything arises between now and then, please don't hesitate to contact our office.   Thank you for allowing [place] to be a part of your medical care!  Thank you, Dr. Robyne Peers

## 2020-05-22 NOTE — Progress Notes (Signed)
  Patient Name: Rebecca Serrano Date of Birth: [birth year] [facility] Prenatal Visit  ELIZZIE WESTERGARD is a 35 y.o. [id number] at [redacted]w[redacted]d here for routine follow up. She is dated by early ultrasound.  She reports no complaints.  She denies vaginal bleeding.  See flow sheet for details.  Vitals:   05/22/20 1415  BP: 120/80  Pulse: 96     A/P: Pregnancy at [redacted]w[redacted]d.  Doing well.    1. Routine Prenatal Care:  Marland Kitchen Dating reviewed, dating tab is correct . Fetal heart tones Appropriate . Influenza vaccine previously administered.   Marland Kitchen COVID vaccination was discussed and patient received 2 vaccinations at a prior time.  . The patient has the following indication for screening preexisting diabetes: BMI > 25 and high risk ethnicity (Latino, [place] American, Native American, [place] Islander, Asian Naval architect) . Marland Kitchen Anatomy ultrasound ordered to be scheduled at 18-20 weeks. . Patient is not interested in genetic screening. As she is past 13 weeks and 6 days, a Referral to genetic counseling as she is 35 or over at time of delivery  was offered.  . Pregnancy education including expected weight gain in pregnancy, OTC medication use, continued use of prenatal vitamin, smoking cessation if applicable, and nutrition in pregnancy.   . Bleeding and pain precautions reviewed.  2. Pregnancy issues include the following and were addressed as appropriate today:  . Patient qualified for early 1 hour Glucola testing, 158. Results discussed with patient, she will be following up in 5 days for 3 hour Glucola testing. . Discussed that patient would benefit from aspirin for preeclampsia prophylaxis.  . Problem list  and pregnancy box updated: Yes.   Follow up 4 weeks. Will plan to acquire preeclampsia labs (TSH and CMP) at next visit.

## 2020-05-27 ENCOUNTER — Encounter: Payer: Self-pay | Admitting: Family Medicine

## 2020-05-27 ENCOUNTER — Other Ambulatory Visit (INDEPENDENT_AMBULATORY_CARE_PROVIDER_SITE_OTHER): Payer: Self-pay

## 2020-05-27 ENCOUNTER — Other Ambulatory Visit: Payer: Self-pay

## 2020-05-27 DIAGNOSIS — Z3A14 14 weeks gestation of pregnancy: Secondary | ICD-10-CM

## 2020-05-27 DIAGNOSIS — Z3490 Encounter for supervision of normal pregnancy, unspecified, unspecified trimester: Secondary | ICD-10-CM

## 2020-05-27 LAB — POCT CBG (FASTING - GLUCOSE)-MANUAL ENTRY: Glucose Fasting, POC: 95 mg/dL (ref 70–99)

## 2020-05-28 LAB — GESTATIONAL GLUCOSE TOLERANCE
Glucose, Fasting: 80 mg/dL (ref 65–94)
Glucose, GTT - 1 Hour: 137 mg/dL (ref 65–179)
Glucose, GTT - 2 Hour: 123 mg/dL (ref 65–154)
Glucose, GTT - 3 Hour: 105 mg/dL (ref 65–139)

## 2020-06-03 ENCOUNTER — Encounter: Payer: Self-pay | Admitting: Family Medicine

## 2020-06-03 ENCOUNTER — Other Ambulatory Visit: Payer: Self-pay | Admitting: Family Medicine

## 2020-06-03 MED ORDER — PRENATAL MULTIVITAMIN CH: 1.0000 | ORAL_TABLET | Freq: Every day | ORAL | 1 refills | Status: DC

## 2020-06-05 MED ORDER — PRENATAL MULTIVITAMIN CH: 1.0000 | ORAL_TABLET | Freq: Every day | ORAL | 1 refills | Status: DC

## 2020-06-05 NOTE — Progress Notes (Signed)
Patient calls nurse line regarding issues with prenatal vitamin rx. Upon chart review, medication was sent to "print"   Attempted to resend electronically. Rx reverted back to print. Called in rx to pharmacy. (Gave verbal to Corinne)  Veronda Prude, RN

## 2020-06-05 NOTE — Addendum Note (Signed)
Addended by: Veronda Prude on: 06/05/2020 02:15 PM   Modules accepted: Orders

## 2020-06-21 NOTE — Progress Notes (Signed)
  Patient Name: Rebecca Serrano Date of Birth: January 03, 1986 Date of Visit: 06/23/20 PCP: Mirian Mo, MD Second Trimester Prenatal Visit   Chief Complaint: prenatal care  Subjective: Rebecca Serrano is a pleasant [phone] at  [redacted]w[redacted]d dated by 10w [place]. She has no unusual complaints today.  She denies vaginal bleeding or discharge. She reports she is starting to feel fetal movement.    ROS: Per HPI.   I have reviewed the patient's medical, surgical, family, and social history as appropriate.   Prior Obstetric History:  First pregnancy was C-section (2006) without complications. Second pregnancy VBAC without complications.  Complications of Current Pregnancy:   Objective: Vitals:   06/23/20 0929  BP: 120/72  Pulse: 98   Last Weight  Most recent update: 06/23/2020  9:29 AM   Weight  92.8 kg (204 lb 9.6 oz)           See prenatal flowsheet for exam.  Fetal heart tones documented in flow sheet  FHR: 142  General:  Alert, oriented and cooperative. Patient is in no acute distress.  Skin: Skin is warm and dry. No rash noted.   Cardiovascular: Normal heart rate noted  Respiratory: Normal respiratory effort, no problems with respiration noted  Abdomen: Soft, gravid, appropriate for gestational age.        Pelvic: Cervical exam deferred        Extremities: Normal range of motion.     Mental Status: Normal mood and affect. Normal behavior. Normal judgment and thought content.    Assessment/Plan: Rebecca Serrano is a pleasant [phone] at [redacted]w[redacted]d presenting for routine second trimester prenatal care. Overall she is doing well.   1. Routine Prenatal Care: . Fetal heart tones Appropriate . Anatomy ultrasound ordered and scheduled for 06/26/20 at 11am at [facility] . Influenza vaccine previously administered. .  . Early Glucola positive, follow up 3 hour negative.  . Pregnancy education provided on the following topics: fetal growth and movement, ultrasound assessment, and upcoming  laboratory assessment.   . Scheduled for [facility] during second trimester on 07/31/20 at 9am. . Preterm labor precautions given.  . Continue Prenatal vitamin . Interested in Warden/ranger - Quad screen including AFP ordered  2. Previous C-section with subsequent VBAC: Desires vaginal delivery. Will need referral to OB at 32 weeks.  3. Moderate risk for Pre-E given age and obesity - currently on daily ASA 81mg    Follow up on 07/31/20 in [facility] clinic   [facility], DO [facility], PGY2 06/23/2020 1:49 PM

## 2020-06-23 ENCOUNTER — Other Ambulatory Visit: Payer: Self-pay

## 2020-06-23 ENCOUNTER — Ambulatory Visit (INDEPENDENT_AMBULATORY_CARE_PROVIDER_SITE_OTHER): Payer: Self-pay | Admitting: Family Medicine

## 2020-06-23 VITALS — BP 120/72 | HR 98 | Wt 204.6 lb

## 2020-06-23 DIAGNOSIS — O99212 Obesity complicating pregnancy, second trimester: Secondary | ICD-10-CM

## 2020-06-23 DIAGNOSIS — Z3A19 19 weeks gestation of pregnancy: Secondary | ICD-10-CM

## 2020-06-23 DIAGNOSIS — O09522 Supervision of elderly multigravida, second trimester: Secondary | ICD-10-CM

## 2020-06-23 DIAGNOSIS — Z3482 Encounter for supervision of other normal pregnancy, second trimester: Secondary | ICD-10-CM

## 2020-06-23 NOTE — Patient Instructions (Signed)
Safe Medications in Pregnancy   Acne: Benzoyl Peroxide Salicylic Acid  Backache/Headache: Tylenol: 2 regular strength every 4 hours OR              2 Extra strength every 6 hours  Colds/Coughs/Allergies: Benadryl (alcohol free) 25 mg every 6 hours as needed Breath right strips Claritin Cepacol throat lozenges Chloraseptic throat spray Cold-Eeze- up to three times per day Cough drops, alcohol free Flonase (by prescription only) Guaifenesin Mucinex Robitussin DM (plain only, alcohol free) Saline nasal spray/drops Sudafed (pseudoephedrine) & Actifed ** use only after [redacted] weeks gestation and if you do not have high blood pressure Tylenol Vicks Vaporub Zinc lozenges Zyrtec   Constipation: Colace Ducolax suppositories Fleet enema Glycerin suppositories Metamucil Milk of magnesia Miralax Senokot Smooth move tea  Diarrhea: Kaopectate Imodium A-D  *NO pepto Bismol  Hemorrhoids: Anusol Anusol HC Preparation H Tucks  Indigestion: Tums Maalox Mylanta Zantac  Pepcid  Insomnia: Benadryl (alcohol free) 25mg  every 6 hours as needed Tylenol PM Unisom, no Gelcaps  Leg Cramps: Tums MagGel  Nausea/Vomiting:  Bonine Dramamine Emetrol Ginger extract Sea bands Meclizine  Nausea medication to take during pregnancy:  Unisom (doxylamine succinate 25 mg tablets) Take one tablet daily at bedtime. If symptoms are not adequately controlled, the dose can be increased to a maximum recommended dose of two tablets daily (1/2 tablet in the morning, 1/2 tablet mid-afternoon and one at bedtime). Vitamin B6 100mg  tablets. Take one tablet twice a day (up to 200 mg per day).  Skin Rashes: Aveeno products Benadryl cream or 25mg  every 6 hours as needed Calamine Lotion 1% cortisone cream  Yeast infection: Gyne-lotrimin 7 Monistat 7   **If taking multiple medications, please check labels to avoid duplicating the same active ingredients **take medication as directed on  the label ** Do not exceed 4000 mg of tylenol in 24 hours **Do not take medications that contain aspirin or ibuprofen    Reasons to return to MAU: 1.  Contractions are  5 minutes apart or less, each last 1 minute, these have been going on for 1-2 hours, and you cannot walk or talk during them 2.  You have a large gush of fluid, or a trickle of fluid that will not stop and you have to wear a pad 3.  You have bleeding that is bright red, heavier than spotting--like menstrual bleeding (spotting can be normal in early labor or after a check of your cervix) 4.  You do not feel the baby moving like he/she normally does

## 2020-07-31 ENCOUNTER — Other Ambulatory Visit: Payer: Self-pay

## 2020-07-31 ENCOUNTER — Ambulatory Visit (INDEPENDENT_AMBULATORY_CARE_PROVIDER_SITE_OTHER): Payer: Self-pay | Admitting: Family Medicine

## 2020-07-31 VITALS — BP 130/67 | HR 98 | Wt 212.8 lb

## 2020-07-31 DIAGNOSIS — Z3A25 25 weeks gestation of pregnancy: Secondary | ICD-10-CM

## 2020-07-31 DIAGNOSIS — O99213 Obesity complicating pregnancy, third trimester: Secondary | ICD-10-CM

## 2020-07-31 DIAGNOSIS — O10912 Unspecified pre-existing hypertension complicating pregnancy, second trimester: Secondary | ICD-10-CM

## 2020-07-31 DIAGNOSIS — Z3492 Encounter for supervision of normal pregnancy, unspecified, second trimester: Secondary | ICD-10-CM

## 2020-07-31 DIAGNOSIS — Z8679 Personal history of other diseases of the circulatory system: Secondary | ICD-10-CM

## 2020-07-31 DIAGNOSIS — O3429 Maternal care due to uterine scar from other previous surgery: Secondary | ICD-10-CM

## 2020-07-31 NOTE — Progress Notes (Signed)
[facility] Visit  Rebecca Serrano is a 35 y.o. [phone] at [redacted]w[redacted]d (via certain LMP) who presents to [facility] for routine follow up. Prenatal course, history, notes, ultrasounds, and laboratory results reviewed.  Denies cramping/ctx, fluid leaking, vaginal bleeding, or decreased fetal movement. Taking PNV.   She does note she has a history of HTN, was on a medication prior to pregnancy prescribed by [facility], can't remember which one, but she stopped it. She denies HA, vision changes, RUQ pain, chest pain, shortness of breath, or swelling.  Primary Prenatal Care Provider: Dr Homero Fellers  Postpartum Plans: - delivery planning: TOLAC - circumcision: N/a female - feeding: both - pediatrician: [facility] - contraception: POPs (tried IUD in past had irregular bleeding, tried POPs in past and liked them)  FHR: 143 Uterine size: 26 cm  Initial BP 130/67 Repeat BP 126/71  Assessment & Plan  1. Routine prenatal care: - Anatomy ultrasound obtained, reviewed with patient, normal, EFW 51%, scanned into chart - Needs 3 hr GTT (as previously failed 1 hr early GTT and passed early 3 hr GTT), CBC, RPR,  and HIV at next visit in 3 weeks - discussed COVID booster, she declined today but is considering  2. H/o Cesarean section, h/o VBAC x1- desiring TOLAC. MFM calculator with 73.3% success.   3. Obesity- BMI 37  4. AMA- negative quad screen 06/25/2020. Declined referral to genetic counselor.  5. History of hypertension, suspect chronic HTN- prior to this pregnancy she notes she was taking antihypertensive medication, but then stopped taking it. She states this was about a year ago and it was prescribed at the [facility]. Thus, suspect she may have chronic HTN, I discussed this is a reason for referral to HROB and she was in agreement. Will get baseline labs including CBC, CMP, urine protein creatinine ratio today. Referral to OB placed. 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 We discussed signs/symptoms of pre-E today and things to watch for, and to call if home BP >140/90.  6. 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  Next prenatal visit in 3 weeks, we will continue to follow until she can get in with OB. Labor & fetal movement precautions discussed.  At Next visit - watch BP - 3 hr GTT, CBC, RPR, HIV - discuss COVID booster - ensure she got growth sono and has appt with OB scheduled  Burley Saver, MD [facility] Faculty

## 2020-07-31 NOTE — Patient Instructions (Addendum)
It was wonderful to see you today.  Please bring ALL of your medications with you to every visit.   Today we talked about:  - We discussed that your anatomy ultrasound was normal - We discussed you are due for a COVID booster, when you decide you want to get this just call and let us know! - We have scheduled you a follow up on 08/20/2020 to do a 3 hr sugar test and other lab work, please coming fasting and be prepared to stay for three hours - We are checking lab work for your history of elevate blood pressure and referring you to OB GYN for your pregnancy care      Pregnancy Related Return Precautions The follow are signs/symptoms that are abnormal in pregnancy and may require further evaluation by a physician: Go to the MAU at [facility] at [facility] if: You have cramping/contractions that do not go away with drinking water, especially if they are lasting 30 seconds to 1.5 minutes, coming and going every 5-10 minutes for an hour or more, or are getting stronger and you cannot walk or talk while having a contraction/cramp. Your water breaks.  Sometimes it is a big gush of fluid, sometimes it is just a trickle that keeps getting your underwear wet or running down your legs You have vaginal bleeding.    You do not feel your baby moving like normal.  If you do not, get something to eat and drink (something cold or something with sugar like peanut butter or juice) and lay down and focus on feeling your baby move. If your baby is still not moving like normal, you should go to MAU. You should feel your baby move 6 times in one hour, or 10 times in two hours. You have a persistent headache that does not go away with 1 g of Tylenol, vision changes, chest pain, difficulty breathing, severe pain in your right upper abdomen, worsening leg swelling- these can all be signs of high blood pressure in pregnancy and need to be evaluated by a provider immediately  These are all concerning in  pregnancy and if you have any of these I recommend you call your PCP and present to the Maternity Admissions Unit (map below) for further evaluation.  For any pregnancy-related emergencies, please go to the Maternity Admissions Unit in the Women's & Children's Center at [facility]. You will use hospital Entrance C.     Thank you for choosing [facility].   Please call [phone] with any questions about today's appointment.  Please be sure to schedule follow up at the front  desk before you leave today.   Burley Saver, MD  Family Medicine

## 2020-08-01 ENCOUNTER — Telehealth: Payer: Self-pay | Admitting: *Deleted

## 2020-08-01 DIAGNOSIS — O10913 Unspecified pre-existing hypertension complicating pregnancy, third trimester: Secondary | ICD-10-CM | POA: Insufficient documentation

## 2020-08-01 DIAGNOSIS — O10912 Unspecified pre-existing hypertension complicating pregnancy, second trimester: Secondary | ICD-10-CM | POA: Insufficient documentation

## 2020-08-01 LAB — CBC
Hematocrit: 35.3 % (ref 34.0–46.6)
Hemoglobin: 12.2 g/dL (ref 11.1–15.9)
MCH: 31.4 pg (ref 26.6–33.0)
MCHC: 34.6 g/dL (ref 31.5–35.7)
MCV: 91 fL (ref 79–97)
Platelets: 310 10*3/uL (ref 150–450)
RBC: 3.89 x10E6/uL (ref 3.77–5.28)
RDW: 13 % (ref 11.7–15.4)
WBC: 12.2 10*3/uL — ABNORMAL HIGH (ref 3.4–10.8)

## 2020-08-01 LAB — COMPREHENSIVE METABOLIC PANEL
ALT: 14 IU/L (ref 0–32)
AST: 18 IU/L (ref 0–40)
Albumin/Globulin Ratio: 1.2 (ref 1.2–2.2)
Albumin: 3.6 g/dL — ABNORMAL LOW (ref 3.8–4.8)
Alkaline Phosphatase: 104 IU/L (ref 44–121)
BUN/Creatinine Ratio: 17 (ref 9–23)
BUN: 8 mg/dL (ref 6–20)
Bilirubin Total: <0.2 mg/dL (ref 0.0–1.2)
CO2: 17 mmol/L — ABNORMAL LOW (ref 20–29)
Calcium: 9.5 mg/dL (ref 8.7–10.2)
Chloride: 105 mmol/L (ref 96–106)
Creatinine, Ser: 0.47 mg/dL — ABNORMAL LOW (ref 0.57–1.00)
Globulin, Total: 2.9 g/dL (ref 1.5–4.5)
Glucose: 122 mg/dL — ABNORMAL HIGH (ref 65–99)
Potassium: 3.9 mmol/L (ref 3.5–5.2)
Sodium: 137 mmol/L (ref 134–144)
Total Protein: 6.5 g/dL (ref 6.0–8.5)
eGFR: 127 mL/min/{1.73_m2} (ref >59)

## 2020-08-01 LAB — PROTEIN / CREATININE RATIO, URINE
Creatinine, Urine: 96.4 mg/dL
Protein, Ur: 12 mg/dL
Protein/Creat Ratio: 124 mg/g creat (ref 0–200)

## 2020-08-01 NOTE — Telephone Encounter (Signed)
Attempted to call to schedule [place] and scheduler said I couldt schedul a follow up because pt hasnt been seen by them. Asked her to transfer me to the ob office and they scheduled her for an appt 08/14/20 @ 2:15pm. ([address]) . Derrian Poli Bruna Potter, CMA

## 2020-08-01 NOTE — Telephone Encounter (Signed)
-----   Message from Billey Co, MD sent at 08/01/2020  8:05 AM EDT ----- Regarding: schedule appt with OB Hi Bernell Sigal,  Okay last message I'm sorry, can you also call over and see if they can get her soon on their schedule to see OB for the rest of her pregnancy, like within the next month? Thanks for helping me with this!  Thanks so much! Margo

## 2020-08-14 ENCOUNTER — Encounter: Payer: Self-pay | Admitting: Family Medicine

## 2020-08-14 ENCOUNTER — Other Ambulatory Visit: Payer: Self-pay

## 2020-08-14 ENCOUNTER — Ambulatory Visit (INDEPENDENT_AMBULATORY_CARE_PROVIDER_SITE_OTHER): Payer: Self-pay | Admitting: Family Medicine

## 2020-08-14 VITALS — BP 118/71 | HR 91 | Wt 214.0 lb

## 2020-08-14 DIAGNOSIS — O10912 Unspecified pre-existing hypertension complicating pregnancy, second trimester: Secondary | ICD-10-CM

## 2020-08-14 DIAGNOSIS — Z98891 History of uterine scar from previous surgery: Secondary | ICD-10-CM | POA: Insufficient documentation

## 2020-08-14 DIAGNOSIS — O99210 Obesity complicating pregnancy, unspecified trimester: Secondary | ICD-10-CM | POA: Insufficient documentation

## 2020-08-14 DIAGNOSIS — O99213 Obesity complicating pregnancy, third trimester: Secondary | ICD-10-CM

## 2020-08-14 DIAGNOSIS — O09529 Supervision of elderly multigravida, unspecified trimester: Secondary | ICD-10-CM | POA: Insufficient documentation

## 2020-08-14 DIAGNOSIS — O0990 Supervision of high risk pregnancy, unspecified, unspecified trimester: Secondary | ICD-10-CM

## 2020-08-14 DIAGNOSIS — O09523 Supervision of elderly multigravida, third trimester: Secondary | ICD-10-CM

## 2020-08-14 NOTE — Progress Notes (Signed)
INITIAL PRENATAL VISIT  Subjective:   Rebecca Serrano is being seen today for her first obstetrical visit.  This is a planned pregnancy. This is a desired pregnancy.  She is at [redacted]w[redacted]d gestation by 10wk [place] Her obstetrical history is significant for advanced maternal age and obesity . Relationship with FOB: spouse, living together. Patient does intend to breast feed. Pregnancy history fully reviewed.  Patient reports no complaints.  Indications for ASA therapy (per uptodate) One of the following: Previous pregnancy with preeclampsia, especially early onset and with an adverse outcome No Multifetal gestation No Chronic hypertension No Type 1 or 2 diabetes mellitus No Chronic kidney disease No Autoimmune disease (antiphospholipid syndrome, systemic lupus erythematosus) No  Two or more of the following: Nulliparity No Obesity (body mass index >30 kg/m2) Yes Family history of preeclampsia in mother or sister No Age ?35 years Yes Sociodemographic characteristics (African American race, low socioeconomic level) Yes Personal risk factors (eg, previous pregnancy with low birth weight or small for gestational age infant, previous adverse pregnancy outcome [eg, stillbirth], interval >10 years between pregnancies) No  Indications for early GDM screening  First-degree relative with diabetes Yes BMI >30kg/m2 Yes Age > 25 Yes Previous birth of an infant weighing ?4000 g No Gestational diabetes mellitus in a previous pregnancy No Glycated hemoglobin ?5.7 percent (39 mmol/mol), impaired glucose tolerance, or impaired fasting glucose on previous testing No High-risk race/ethnicity (eg, African American, Latino, Native American, Asian American, Pacific Islander) Yes Previous stillbirth of unknown cause No Maternal birthweight > 9 lbs No History of cardiovascular disease No Hypertension or on therapy for hypertension No High-density lipoprotein cholesterol level <35 mg/dL (2.03 mmol/L) and/or a  triglyceride level >250 mg/dL (5.59 mmol/L) No Polycystic ovary syndrome No Physical inactivity No Other clinical condition associated with insulin resistance (eg, severe obesity, acanthosis nigricans) No Current use of glucocorticoids No   Early screening tests: FBS, A1C, Random CBG, glucose challenge   Review of Systems:   Review of Systems  Objective:    Obstetric History OB History  Gravida Para Term Preterm AB Living  3 2 2  0 0 2  SAB IAB Ectopic Multiple Live Births  0 0 0 0 2    # Outcome Date GA Lbr Len/2nd Weight Sex Delivery Anes PTL Lv  3 Current           2 Term 08/10/11 [redacted]w[redacted]d 05:01 / 01:42 8 lb 0.6 oz (3.645 kg) F VBAC EPI  LIV  1 Term 04/11/04 [redacted]w[redacted]d  7 lb 8 oz (3.402 kg) M CS-LTranv Spinal N LIV     Birth Comments: Induced at 41 weeks, went for c/s due to failure to progress and non-reassuring fetal heart tones.    Past Medical History:  Diagnosis Date   Medical history non-contributory    No pertinent past medical history     Past Surgical History:  Procedure Laterality Date   CESAREAN SECTION      Current Outpatient Medications on File Prior to Visit  Medication Sig Dispense Refill   aspirin EC 81 MG tablet Take 81 mg by mouth daily. Swallow whole.     Prenatal Vit-Fe Fumarate-FA (PRENATAL MULTIVITAMIN) TABS tablet Take 1 tablet by mouth daily at 12 noon. 90 tablet 1   No current facility-administered medications on file prior to visit.    No Known Allergies  Social History:  reports that she has never smoked. She has never used smokeless tobacco. She reports that she does not drink alcohol and does  not use drugs.  Family History  Problem Relation Age of Onset   Diabetes Father    Cancer Father    Hyperlipidemia Father    Drug abuse Paternal Aunt    Drug abuse Paternal Uncle    Early death Paternal Grandfather    Other Neg Hx     The following portions of the patient's history were reviewed and updated as appropriate: allergies, current  medications, past family history, past medical history, past social history, past surgical history and problem list.  Review of Systems Review of Systems  Constitutional:  Negative for chills and fever.  HENT:  Negative for congestion and sore throat.   Eyes:  Negative for pain and visual disturbance.  Respiratory:  Negative for cough, chest tightness and shortness of breath.   Cardiovascular:  Negative for chest pain.  Gastrointestinal:  Negative for abdominal pain, diarrhea, nausea and vomiting.  Endocrine: Negative for cold intolerance and heat intolerance.  Genitourinary:  Negative for dysuria and flank pain.  Musculoskeletal:  Negative for back pain.  Skin:  Negative for rash.  Allergic/Immunologic: Negative for food allergies.  Neurological:  Negative for dizziness and light-headedness.  Psychiatric/Behavioral:  Negative for agitation.      Physical Exam:  BP 118/71   Pulse 91   Wt 214 lb (97.1 kg)   LMP 02/13/2020 (Approximate)   BMI 40.43 kg/m  CONSTITUTIONAL: Well-developed, well-nourished female in no acute distress.  HENT:  Normocephalic, atraumatic, External right and left ear normal. Oropharynx is clear and moist EYES: Conjunctivae normal. No scleral icterus.  NECK: Normal range of motion, supple, no masses.  Normal thyroid.  SKIN: Skin is warm and dry. No rash noted. Not diaphoretic. No erythema. No pallor. MUSCULOSKELETAL: Normal range of motion. No tenderness.  No cyanosis, clubbing, or edema.   NEUROLOGIC: Alert and oriented to person, place, and time. Normal muscle tone coordination.  PSYCHIATRIC: Normal mood and affect. Normal behavior. Normal judgment and thought content. CARDIOVASCULAR: Normal heart rate noted, regular rhythm RESPIRATORY: Clear to auscultation bilaterally. Effort and breath sounds normal, no problems with respiration noted. BREASTS: Symmetric in size. No masses, skin changes, nipple drainage, or lymphadenopathy. ABDOMEN: Soft, normal bowel  sounds, no distention noted.  No tenderness, rebound or guarding. Fundal ht: 27 PELVIC: deferred FHR: 144   Assessment:    Pregnancy: [id number]   Plan:   1. Chronic hypertension with exacerbation during pregnancy in second trimester BP WNL Patient reports she has the meds at home  - [place] MFM OB FOLLOW UP; Future  2. Supervision of high risk pregnancy, antepartum Up to date  Reviewed care at [facility] Desires COVID vaccine-- has appt at [facility] to have this - [place] MFM OB FOLLOW UP; Future  3. Multigravida of advanced maternal age in third trimester Quad neg - [place] MFM OB FOLLOW UP; Future  4. Obesity affecting pregnancy in third trimester TWG=13 lb (5.897 kg) which is at goal  5. History of C-section Desires VBAC, given consent form to read today   Federico Flake, MD 08/14/2020 2:45 PM

## 2020-08-19 NOTE — Progress Notes (Signed)
    SUBJECTIVE:   CHIEF COMPLAINT / HPI:   Rebecca Serrano is a 35 y.o. G3P2002 at [redacted]w[redacted]d here for COVID booster. Follows with high risk OB due to her chronic HTN. She was seen on 7/7 by Dr. Alvester Morin for follow up OB care and was recommended to come here to get her  vaccine.   Today she feels well and denies vaginal bleeding,leakage of fluid, cramping or contractions. BP appropriate at 112/80.   Scheduled to see Dr. Mervyn Skeeters on 7/22   OBJECTIVE:   LMP 02/13/2020 (Approximate)    General: alert, pleasant, NAD CV: RRR no murmurs Resp: CTAB normal WOB Derm: No LE edema  Fetal HR: 148bpm   ASSESSMENT/PLAN:   No problem-specific Assessment & Plan notes found for this encounter.   Pregnancy, 28wks Feeling well today, presents for booster. Following with high risk OB. Has chronic HTN and is taking aspirin. BP today appropriate at 112/80. Hx previous C section and desires VBAC - follow up scheduled with OB MFM on 7/22  COVID-19 immunization - gave booster   Cora Collum, DO [facility]

## 2020-08-20 ENCOUNTER — Other Ambulatory Visit: Payer: Self-pay

## 2020-08-20 ENCOUNTER — Ambulatory Visit (INDEPENDENT_AMBULATORY_CARE_PROVIDER_SITE_OTHER): Payer: Self-pay

## 2020-08-20 ENCOUNTER — Ambulatory Visit (INDEPENDENT_AMBULATORY_CARE_PROVIDER_SITE_OTHER): Payer: Self-pay | Admitting: Family Medicine

## 2020-08-20 VITALS — BP 112/80 | HR 87 | Wt 214.4 lb

## 2020-08-20 DIAGNOSIS — Z23 Encounter for immunization: Secondary | ICD-10-CM

## 2020-08-20 DIAGNOSIS — Z3A27 27 weeks gestation of pregnancy: Secondary | ICD-10-CM

## 2020-08-21 NOTE — Patient Instructions (Addendum)
It was great seeing you today! Glad to see you are  doing well! You came in for the COVID-19 booster.   Go to the MAU at [facility] at [facility] if: You have cramping/contractions that do not go away with drinking water Your water breaks.  Sometimes it is a big gush of fluid, sometimes it is just a trickle that keeps getting your underwear wet or running down your legs You have vaginal bleeding.    You do not feel your baby moving like normal.  If you do not, get something to eat and drink and lay down and focus on feeling your baby move. If your baby is still not moving like normal, you should go to MAU.   Please be sure to follow up with MFM at your scheduled appointment on 7/22.  Feel free to call with any questions or concerns at any time, at [phone].   Take care,  Dr. Cora Collum [facility]

## 2020-08-29 ENCOUNTER — Encounter: Payer: Self-pay | Admitting: Obstetrics & Gynecology

## 2020-08-29 ENCOUNTER — Other Ambulatory Visit: Payer: Self-pay

## 2020-08-29 ENCOUNTER — Ambulatory Visit (INDEPENDENT_AMBULATORY_CARE_PROVIDER_SITE_OTHER): Payer: Self-pay | Admitting: Obstetrics & Gynecology

## 2020-08-29 VITALS — BP 114/72 | HR 82 | Wt 215.4 lb

## 2020-08-29 DIAGNOSIS — Z23 Encounter for immunization: Secondary | ICD-10-CM

## 2020-08-29 DIAGNOSIS — Z3A29 29 weeks gestation of pregnancy: Secondary | ICD-10-CM

## 2020-08-29 DIAGNOSIS — O0990 Supervision of high risk pregnancy, unspecified, unspecified trimester: Secondary | ICD-10-CM

## 2020-08-29 DIAGNOSIS — O10913 Unspecified pre-existing hypertension complicating pregnancy, third trimester: Secondary | ICD-10-CM

## 2020-08-29 DIAGNOSIS — Z98891 History of uterine scar from previous surgery: Secondary | ICD-10-CM

## 2020-08-29 NOTE — Progress Notes (Signed)
   PRENATAL VISIT NOTE  Subjective:  Rebecca Serrano is a 35 y.o. G3P2002 at [redacted]w[redacted]d being seen today for ongoing prenatal care.  She is currently monitored for the following issues for this high-risk pregnancy and has Stress incontinence; Obesity, unspecified; Seasonal allergies; Low grade squamous intraepithelial lesion (LGSIL) on Papanicolaou smear of cervix; Dysplasia of cervix, low grade (CIN 1); Weight gain; Supervision of high risk pregnancy, antepartum; Chronic hypertension with exacerbation during pregnancy in second trimester; Advanced maternal age in multigravida; Obesity affecting pregnancy; and History of C-section on their problem list.  Patient reports  no significant complaints .  Contractions: Not present. Vag. Bleeding: None.  Movement: Present. Denies leaking of fluid.   The following portions of the patient's history were reviewed and updated as appropriate: allergies, current medications, past family history, past medical history, past social history, past surgical history and problem list.   Objective:   Vitals:   08/29/20 0829  BP: 114/72  Pulse: 82  Weight: 215 lb 6.4 oz (97.7 kg)    Fetal Status: Fetal Heart Rate (bpm): 140   Movement: Present     General:  Alert, oriented and cooperative. Patient is in no acute distress.  Skin: Skin is warm and dry. No rash noted.   Cardiovascular: Normal heart rate noted  Respiratory: Normal respiratory effort, no problems with respiration noted  Abdomen: Soft, gravid, appropriate for gestational age.  Pain/Pressure: Present     Pelvic: Cervical exam deferred        Extremities: Normal range of motion.     Mental Status: Normal mood and affect. Normal behavior. Normal judgment and thought content.   Assessment and Plan:  Pregnancy: G3P2002 at [redacted]w[redacted]d 1. Chronic hypertension with exacerbation during pregnancy in third trimester Stable BP, no medications. Continue ASA. Will check surveillance labs today along with other third  trimester labs.  Growth scans as per MFM, already scheduled. - Comprehensive metabolic panel - Protein / creatinine ratio, urine  2. History of C-section Counseled regarding TOLAC vs RCS; risks/benefits discussed in detail. All questions answered.  Patient elects for TOLAC, consent signed 08/29/2020.  3. Need for Tdap vaccination - Tdap vaccine greater than or equal to 7yo IM given today  4. [redacted] weeks gestation of pregnancy 5. Supervision of high risk pregnancy, antepartum Third trimester labs done today, will follow up results and manage accordingly. Preterm labor symptoms and general obstetric precautions including but not limited to vaginal bleeding, contractions, leaking of fluid and fetal movement were reviewed in detail with the patient. Please refer to After Visit Summary for other counseling recommendations.   Return in about 2 weeks (around 09/12/2020) for OFFICE OB VISIT (MD only).  Future Appointments  Date Time Provider Department Center  09/04/2020  8:15 AM [facility] NURSE [facility]  09/04/2020  8:30 AM [facility]  09/12/2020  9:35 AM Wolford Bing, MD [facility]    Jaynie Collins, MD

## 2020-08-29 NOTE — Patient Instructions (Signed)
Return to office for any scheduled appointments. Call the office or go to the MAU at [facility] if: You begin to have strong, frequent contractions Your water breaks.  Sometimes it is a big gush of fluid, sometimes it is just a trickle that keeps getting your panties wet or running down your legs You have vaginal bleeding.  It is normal to have a small amount of spotting if your cervix was checked.  You do not feel your baby moving like normal.  If you do not, get something to eat and drink and lay down and focus on feeling your baby move.   If your baby is still not moving like normal, you should call the office or go to MAU. Any other obstetric concerns. [email or web address]">  Third Trimester of Pregnancy  The third trimester of pregnancy is from week 28 through week 40. This is months 7 through 9. The third trimester is a time when the unborn baby (fetus) is growing rapidly. At the end of the ninth month, the fetus is about 20inches long and weighs 6-10 pounds. Body changes during your third trimester During the third trimester, your body will continue to go through many changes.The changes vary and generally return to normal after your baby is born. Physical changes Your weight will continue to increase. You can expect to gain 25-35 pounds (11-16 kg) by the end of the pregnancy if you begin pregnancy at a normal weight. If you are underweight, you can expect to gain 28-40 lb (about 13-18 kg), and if you are overweight, you can expect to gain 15-25 lb (about 7-11 kg). You may begin to get stretch marks on your hips, abdomen, and breasts. Your breasts will continue to grow and may hurt. A yellow fluid (colostrum) may leak from your breasts. This is the first milk you are producing for your baby. You may have changes in your hair. These can include thickening of your hair, rapid growth, and changes in texture. Some  people also have hair loss during or after pregnancy, or hair that feels dry or thin. Your belly button may stick out. You may notice more swelling in your hands, face, or ankles. Health changes You may have heartburn. You may have constipation. You may develop hemorrhoids. You may develop swollen, bulging veins (varicose veins) in your legs. You may have increased body aches in the pelvis, back, or thighs. This is due to weight gain and increased hormones that are relaxing your joints. You may have increased tingling or numbness in your hands, arms, and legs. The skin on your abdomen may also feel numb. You may feel short of breath because of your expanding uterus. Other changes You may urinate more often because the fetus is moving lower into your pelvis and pressing on your bladder. You may have more problems sleeping. This may be caused by the size of your abdomen, an increased need to urinate, and an increase in your body's metabolism. You may notice the fetus "dropping," or moving lower in your abdomen (lightening). You may have increased vaginal discharge. You may notice that you have pain around your pelvic bone as your uterus distends. Follow these instructions at home: Medicines Follow your health care provider's instructions regarding medicine use. Specific medicines may be either safe or unsafe to take during pregnancy. Do not take any medicines unless approved by your health care provider. Take a prenatal vitamin that contains at least 600 micrograms (mcg) of folic acid. Eating  and drinking Eat a healthy diet that includes fresh fruits and vegetables, whole grains, good sources of protein such as meat, eggs, or tofu, and low-fat dairy products. Avoid raw meat and unpasteurized juice, milk, and cheese. These carry germs that can harm you and your baby. Eat 4 or 5 small meals rather than 3 large meals a day. You may need to take these actions to prevent or treat  constipation: Drink enough fluid to keep your urine pale yellow. Eat foods that are high in fiber, such as beans, whole grains, and fresh fruits and vegetables. Limit foods that are high in fat and processed sugars, such as fried or sweet foods. Activity Exercise only as directed by your health care provider. Most people can continue their usual exercise routine during pregnancy. Try to exercise for 30 minutes at least 5 days a week. Stop exercising if you experience contractions in the uterus. Stop exercising if you develop pain or cramping in the lower abdomen or lower back. Avoid heavy lifting. Do not exercise if it is very hot or humid or if you are at a high altitude. If you choose to, you may continue to have sex unless your health care provider tells you not to. Relieving pain and discomfort Take frequent breaks and rest with your legs raised (elevated) if you have leg cramps or low back pain. Take warm sitz baths to soothe any pain or discomfort caused by hemorrhoids. Use hemorrhoid cream if your health care provider approves. Wear a supportive bra to prevent discomfort from breast tenderness. If you develop varicose veins: Wear support hose as told by your health care provider. Elevate your feet for 15 minutes, 3-4 times a day. Limit salt in your diet. Safety Talk to your health care provider before traveling far distances. Do not use hot tubs, steam rooms, or saunas. Wear your seat belt at all times when driving or riding in a car. Talk with your health care provider if someone is verbally or physically abusive to you. Preparing for birth To prepare for the arrival of your baby: Take prenatal classes to understand, practice, and ask questions about labor and delivery. Visit the hospital and tour the maternity area. Purchase a rear-facing car seat and make sure you know how to install it in your car. Prepare the baby's room or sleeping area. Make sure to remove all pillows and  stuffed animals from the baby's crib to prevent suffocation. General instructions Avoid cat litter boxes and soil used by cats. These carry germs that can cause birth defects in the baby. If you have a cat, ask someone to clean the litter box for you. Do not douche or use tampons. Do not use scented sanitary pads. Do not use any products that contain nicotine or tobacco, such as cigarettes, e-cigarettes, and chewing tobacco. If you need help quitting, ask your health care provider. Do not use any herbal remedies, illegal drugs, or medicines that were not prescribed to you. Chemicals in these products can harm your baby. Do not drink alcohol. You will have more frequent prenatal exams during the third trimester. During a routine prenatal visit, your health care provider will do a physical exam, perform tests, and discuss your overall health. Keep all follow-up visits. This is important. Where to find more information American Pregnancy Association: americanpregnancy.org [employer] of Obstetricians and Gynecologists: https://www.todd-brady.net/ Office on [employer] Health: MightyReward.co.nz Contact a health care provider if you have: A fever. Mild pelvic cramps, pelvic pressure, or nagging pain in  your abdominal area or lower back. Vomiting or diarrhea. Bad-smelling vaginal discharge or foul-smelling urine. Pain when you urinate. A headache that does not go away when you take medicine. Visual changes or see spots in front of your eyes. Get help right away if: Your water breaks. You have regular contractions less than 5 minutes apart. You have spotting or bleeding from your vagina. You have severe abdominal pain. You have difficulty breathing. You have chest pain. You have fainting spells. You have not felt your baby move for the time period told by your health care provider. You have new or increased pain, swelling, or redness in an arm or leg. Summary The third  trimester of pregnancy is from week 28 through week 40 (months 7 through 9). You may have more problems sleeping. This can be caused by the size of your abdomen, an increased need to urinate, and an increase in your body's metabolism. You will have more frequent prenatal exams during the third trimester. Keep all follow-up visits. This is important. This information is not intended to replace advice given to you by your health care provider. Make sure you discuss any questions you have with your healthcare provider. Document Revised: 07/04/2019 Document Reviewed: 05/10/2019 Elsevier Patient Education  2022 [employer].

## 2020-08-30 LAB — CBC
Hematocrit: 39.2 % (ref 34.0–46.6)
Hemoglobin: 13.3 g/dL (ref 11.1–15.9)
MCH: 30.6 pg (ref 26.6–33.0)
MCHC: 33.9 g/dL (ref 31.5–35.7)
MCV: 90 fL (ref 79–97)
Platelets: 282 10*3/uL (ref 150–450)
RBC: 4.35 x10E6/uL (ref 3.77–5.28)
RDW: 12.5 % (ref 11.7–15.4)
WBC: 11.5 10*3/uL — ABNORMAL HIGH (ref 3.4–10.8)

## 2020-08-30 LAB — COMPREHENSIVE METABOLIC PANEL
ALT: 14 IU/L (ref 0–32)
AST: 18 IU/L (ref 0–40)
Albumin/Globulin Ratio: 1.1 — ABNORMAL LOW (ref 1.2–2.2)
Albumin: 4 g/dL (ref 3.8–4.8)
Alkaline Phosphatase: 139 IU/L — ABNORMAL HIGH (ref 44–121)
BUN/Creatinine Ratio: 10 (ref 9–23)
BUN: 6 mg/dL (ref 6–20)
Bilirubin Total: <0.2 mg/dL (ref 0.0–1.2)
CO2: 14 mmol/L — ABNORMAL LOW (ref 20–29)
Calcium: 9.7 mg/dL (ref 8.7–10.2)
Chloride: 104 mmol/L (ref 96–106)
Creatinine, Ser: 0.6 mg/dL (ref 0.57–1.00)
Globulin, Total: 3.6 g/dL (ref 1.5–4.5)
Glucose: 88 mg/dL (ref 65–99)
Potassium: 4.1 mmol/L (ref 3.5–5.2)
Sodium: 143 mmol/L (ref 134–144)
Total Protein: 7.6 g/dL (ref 6.0–8.5)
eGFR: 120 mL/min/{1.73_m2} (ref >59)

## 2020-08-30 LAB — HIV ANTIBODY (ROUTINE TESTING W REFLEX): HIV Screen 4th Generation wRfx: NONREACTIVE

## 2020-08-30 LAB — PROTEIN / CREATININE RATIO, URINE
Creatinine, Urine: 75 mg/dL
Protein, Ur: 20.7 mg/dL
Protein/Creat Ratio: 276 mg/g creat — ABNORMAL HIGH (ref 0–200)

## 2020-08-30 LAB — GLUCOSE TOLERANCE, 2 HOURS W/ 1HR
Glucose, 1 hour: 148 mg/dL (ref 65–179)
Glucose, 2 hour: 135 mg/dL (ref 65–152)
Glucose, Fasting: 83 mg/dL (ref 65–91)

## 2020-08-30 LAB — RPR: RPR Ser Ql: NONREACTIVE

## 2020-09-04 ENCOUNTER — Ambulatory Visit: Payer: Self-pay | Attending: Family Medicine

## 2020-09-04 ENCOUNTER — Other Ambulatory Visit: Payer: Self-pay

## 2020-09-04 ENCOUNTER — Other Ambulatory Visit: Payer: Self-pay | Admitting: *Deleted

## 2020-09-04 ENCOUNTER — Ambulatory Visit: Payer: Self-pay | Admitting: *Deleted

## 2020-09-04 ENCOUNTER — Encounter: Payer: Self-pay | Admitting: *Deleted

## 2020-09-04 VITALS — BP 126/65 | HR 86

## 2020-09-04 DIAGNOSIS — Z98891 History of uterine scar from previous surgery: Secondary | ICD-10-CM

## 2020-09-04 DIAGNOSIS — O10912 Unspecified pre-existing hypertension complicating pregnancy, second trimester: Secondary | ICD-10-CM | POA: Insufficient documentation

## 2020-09-04 DIAGNOSIS — O0990 Supervision of high risk pregnancy, unspecified, unspecified trimester: Secondary | ICD-10-CM | POA: Insufficient documentation

## 2020-09-04 DIAGNOSIS — O10913 Unspecified pre-existing hypertension complicating pregnancy, third trimester: Secondary | ICD-10-CM

## 2020-09-11 ENCOUNTER — Encounter: Payer: Self-pay | Admitting: *Deleted

## 2020-09-12 ENCOUNTER — Ambulatory Visit (INDEPENDENT_AMBULATORY_CARE_PROVIDER_SITE_OTHER): Payer: Self-pay | Admitting: Obstetrics & Gynecology

## 2020-09-12 ENCOUNTER — Other Ambulatory Visit: Payer: Self-pay

## 2020-09-12 ENCOUNTER — Encounter: Payer: Self-pay | Admitting: Obstetrics & Gynecology

## 2020-09-12 VITALS — BP 120/76 | HR 89 | Wt 217.3 lb

## 2020-09-12 DIAGNOSIS — Z98891 History of uterine scar from previous surgery: Secondary | ICD-10-CM

## 2020-09-12 DIAGNOSIS — O0990 Supervision of high risk pregnancy, unspecified, unspecified trimester: Secondary | ICD-10-CM

## 2020-09-12 DIAGNOSIS — R12 Heartburn: Secondary | ICD-10-CM

## 2020-09-12 DIAGNOSIS — Z3A31 31 weeks gestation of pregnancy: Secondary | ICD-10-CM

## 2020-09-12 DIAGNOSIS — O10913 Unspecified pre-existing hypertension complicating pregnancy, third trimester: Secondary | ICD-10-CM

## 2020-09-12 DIAGNOSIS — O26893 Other specified pregnancy related conditions, third trimester: Secondary | ICD-10-CM

## 2020-09-12 MED ORDER — FAMOTIDINE 20 MG PO TABS: 20.0000 mg | ORAL_TABLET | Freq: Two times a day (BID) | ORAL | 3 refills | Status: DC

## 2020-09-12 NOTE — Progress Notes (Signed)
PRENATAL VISIT NOTE  Subjective:  Rebecca Serrano is a 35 y.o. G3P2002 at [redacted]w[redacted]d being seen today for ongoing prenatal care.  She is currently monitored for the following issues for this high-risk pregnancy and has Stress incontinence; Obesity, unspecified; Seasonal allergies; Low grade squamous intraepithelial lesion (LGSIL) on Papanicolaou smear of cervix; Dysplasia of cervix, low grade (CIN 1); Weight gain; Supervision of high risk pregnancy, antepartum; Chronic hypertension in obstetric context, third trimester; Advanced maternal age in multigravida; Obesity affecting pregnancy; and History of C-section on their problem list.  Patient reports heartburn not alleviated with Tums.  Contractions: Irritability. Vag. Bleeding: None.  Movement: Present. Denies leaking of fluid.   The following portions of the patient's history were reviewed and updated as appropriate: allergies, current medications, past family history, past medical history, past social history, past surgical history and problem list.   Objective:   Vitals:   09/12/20 0953  BP: 120/76  Pulse: 89  Weight: 217 lb 4.8 oz (98.6 kg)    Fetal Status: Fetal Heart Rate (bpm): 144   Movement: Present     General:  Alert, oriented and cooperative. Patient is in no acute distress.  Skin: Skin is warm and dry. No rash noted.   Cardiovascular: Normal heart rate noted  Respiratory: Normal respiratory effort, no problems with respiration noted  Abdomen: Soft, gravid, appropriate for gestational age.  Pain/Pressure: Present     Pelvic: Cervical exam deferred        Extremities: Normal range of motion.  Edema: Trace  Mental Status: Normal mood and affect. Normal behavior. Normal judgment and thought content.   Imaging: [place] MFM OB DETAIL +14 WK  Result Date: 09/04/2020 ----------------------------------------------------------------------  OBSTETRICS REPORT                       (Signed Final 09/04/2020 05:00 pm)  ---------------------------------------------------------------------- Patient Info  ID #:       [id number]                          D.O.B.:  May 27, 1985 (34 yrs)  Name:       Rebecca Serrano                  Visit Date: 09/04/2020 09:00 am ---------------------------------------------------------------------- Performed By  Attending:        Ma Rings MD         Ref. Address:     [address]                                                             [address]  Performed By:     Jeanmarie Plant       [facility]  Referred By:      Federico Flake MD ---------------------------------------------------------------------- Orders  #  Description                           Code        Ordered By  1  [place] MFM OB DETAIL +14 WK               76811.01    [facility] ----------------------------------------------------------------------  #  Order #                     Accession #                Episode #  1  [id number]                   [id number]                 [id number] ---------------------------------------------------------------------- Indications  Advanced maternal age multigravida 57+,        O26.523  third trimester  Obesity complicating pregnancy                 O99.210 E66.9  [redacted] weeks gestation of pregnancy                Z3A.30  Hypertension - Chronic/Pre-existing            O10.019  History of c/s  Encounter for antenatal screening for          Z36.3  malformations ---------------------------------------------------------------------- Fetal Evaluation  Num Of Fetuses:         1  Preg. Location:         Intrauterine  Fetal Heart Rate(bpm):  137  Cardiac Activity:       Observed  Presentation:           Transverse, head to maternal left  Placenta:                Anterior  P. Cord Insertion:      Not well visualized  Amniotic Fluid  AFI FV:      Within normal limits  AFI Sum(cm)     %Tile       Largest Pocket(cm)  13.4            41          7.4  RUQ(cm)       RLQ(cm)       LUQ(cm)        LLQ(cm)  0             2             7.4            4 ---------------------------------------------------------------------- Biophysical Evaluation  Amniotic F.V:   Within normal limits       F. Tone:        Observed  F. Movement:    Observed  Score:          8/8  F. Breathing:   Observed ---------------------------------------------------------------------- Biometry  BPD:      70.4  mm     G. Age:  28w 2d        2.9  %    CI:        66.55   %    70 - 86                                                          FL/HC:      20.4   %    19.2 - 21.4  HC:      276.6  mm     G. Age:  30w 2d         18  %    HC/AC:      1.01        0.99 - 1.21  AC:      272.9  mm     G. Age:  31w 3d         80  %    FL/BPD:     80.3   %    71 - 87  FL:       56.5  mm     G. Age:  29w 5d         23  %    FL/AC:      20.7   %    20 - 24  HUM:      50.4  mm     G. Age:  29w 4d         40  %  LV:          8  mm  Est. FW:    1581  gm      3 lb 8 oz     49  % ---------------------------------------------------------------------- OB History  Blood Type:   O+  Gravidity:    3         Term:   2 ---------------------------------------------------------------------- Gestational Age  LMP:           29w 1d        Date:  02/13/20                 EDD:   11/19/20  U/S Today:     30w 0d                                        EDD:   11/13/20  Best:          30w 1d     Det. ByMarcella Dubs:  Early Ultrasound         EDD:   11/12/20                                      (04/17/20) ---------------------------------------------------------------------- Anatomy  Cranium:               Appears normal         LVOT:  Not well visualized  Cavum:                 Appears normal         Aortic Arch:            Not well  visualized  Ventricles:            Appears normal         Ductal Arch:            Not well visualized  Choroid Plexus:        Limited Views          Diaphragm:              Appears normal  Cerebellum:            Limited Views          Stomach:                Appears normal, left                                                                        sided  Posterior Fossa:       Limited Views          Abdomen:                Appears normal  Nuchal Fold:           Not applicable (>20    Abdominal Wall:         Appears nml (cord                         wks GA)                                        insert, abd wall)  Face:                  Limited Views          Cord Vessels:           Appears normal (3                                                                        vessel cord)  Lips:                  Limited Views          Kidneys:                Appear normal  Palate:                Not well visualized    Bladder:                Appears normal  Thoracic:              Appears  normal         Spine:                  Not well visualized  Heart:                 Limited Views          Upper Extremities:      Visualized  RVOT:                  Not well visualized    Lower Extremities:      Visualized  Other:  Fetus appears to be female. Technicallly difficult due to advanced GA          and maternal habitus. ---------------------------------------------------------------------- Cervix Uterus Adnexa  Cervix  Length:           4.38  cm.  Normal appearance by transabdominal scan.  Uterus  No abnormality visualized.  Right Ovary  Not visualized.  Left Ovary  Not visualized.  Cul De Sac  No free fluid seen.  Adnexa  No abnormality visualized. ---------------------------------------------------------------------- Comments  This patient was seen for a detailed fetal anatomy scan due  to advanced maternal age and maternal obesity with a BMI of  41.  She has a history of chronic hypertension that is not  currently treated  with any medications.  She denies any other significant past medical history and  denies any problems in her current pregnancy.  She has declined all screening tests for fetal aneuploidy in  her current pregnancy.  She was informed that the fetal growth and amniotic fluid  level were appropriate for her gestational age.  There were no obvious fetal anomalies noted on today's  ultrasound exam.  However, today's exam was limited due to  her advanced gestational age.  The patient was informed that anomalies may be missed due  to technical limitations. If the fetus is in a suboptimal position  or maternal habitus is increased, visualization of the fetus in  the maternal uterus may be impaired.  Due to maternal obesity and chronic hypertension, a follow-  up exam was scheduled in 4 weeks.  As her BMI is greater than 40, weekly fetal testing should be  started at 36 weeks. ----------------------------------------------------------------------                   Ma Rings, MD Electronically Signed Final Report   09/04/2020 05:00 pm ----------------------------------------------------------------------   Assessment and Plan:  Pregnancy: [id number] at [redacted]w[redacted]d 1. Heartburn during pregnancy in third trimester Pepcid prescribed. - famotidine (PEPCID) 20 MG tablet; Take 1 tablet (20 mg total) by mouth 2 (two) times daily.  Dispense: 60 tablet; Refill: 3  2. Chronic hypertension with exacerbation during pregnancy in third trimester Stable BP, no medications. Continue ASA.   Growth scans as per MFM, already scheduled. As per MFM, due to CHTN, BMI >40, BPP needs to start at 36 weeks.  3. History of C-section Counseled regarding TOLAC vs RCS; risks/benefits discussed in detail. All questions answered.  Patient elects for TOLAC, consent signed 08/29/2020.  4. [redacted] weeks gestation of pregnancy 5. Supervision of high risk pregnancy, antepartum Preterm labor symptoms and general obstetric precautions including but not limited  to vaginal bleeding, contractions, leaking of fluid and fetal movement were reviewed in detail with the patient. Please refer to After Visit Summary for other counseling recommendations.   Return in about 2 weeks (around 09/26/2020) for OFFICE OB VISIT (MD only).  Future Appointments  Date Time Provider Department Center  09/26/2020 10:55 AM Venora Maples, MD [facility]  10/02/2020  9:30 AM [facility] NURSE [facility]  10/02/2020  9:45 AM [facility] US5 [facility]  10/09/2020  8:55 AM Warden Fillers, MD [facility]    Jaynie Collins, MD

## 2020-09-12 NOTE — Patient Instructions (Signed)
Return to office for any scheduled appointments. Call the office or [facility] the [facility] [place] if:  You begin to have strong, frequent contractions  Your water breaks.  Sometimes it is a big gush of fluid, sometimes it is just a trickle that keeps getting your panties wet or running down your legs  You have vaginal bleeding.  It is normal to have a small amount of spotting if your cervix was checked.   You do not feel your baby moving like normal.  If you do not, get something to eat and drink and lay down and focus on feeling your baby move.   If your baby is still not moving like normal, you should call the office or go to MAU.  Any other obstetric concerns.   

## 2020-09-26 ENCOUNTER — Encounter: Payer: Self-pay | Admitting: Family Medicine

## 2020-09-26 ENCOUNTER — Ambulatory Visit (INDEPENDENT_AMBULATORY_CARE_PROVIDER_SITE_OTHER): Payer: Self-pay | Admitting: Family Medicine

## 2020-09-26 ENCOUNTER — Other Ambulatory Visit: Payer: Self-pay

## 2020-09-26 VITALS — BP 115/73 | HR 88 | Wt 220.3 lb

## 2020-09-26 DIAGNOSIS — E669 Obesity, unspecified: Secondary | ICD-10-CM

## 2020-09-26 DIAGNOSIS — O99213 Obesity complicating pregnancy, third trimester: Secondary | ICD-10-CM

## 2020-09-26 DIAGNOSIS — O0990 Supervision of high risk pregnancy, unspecified, unspecified trimester: Secondary | ICD-10-CM

## 2020-09-26 DIAGNOSIS — Z3A33 33 weeks gestation of pregnancy: Secondary | ICD-10-CM

## 2020-09-26 DIAGNOSIS — O10913 Unspecified pre-existing hypertension complicating pregnancy, third trimester: Secondary | ICD-10-CM

## 2020-09-26 DIAGNOSIS — O09523 Supervision of elderly multigravida, third trimester: Secondary | ICD-10-CM

## 2020-09-26 DIAGNOSIS — O34219 Maternal care for unspecified type scar from previous cesarean delivery: Secondary | ICD-10-CM

## 2020-09-26 DIAGNOSIS — Z98891 History of uterine scar from previous surgery: Secondary | ICD-10-CM

## 2020-09-26 NOTE — Progress Notes (Signed)
Pt states no questions nor concerns today.

## 2020-09-26 NOTE — Progress Notes (Signed)
I connected with Rebecca Serrano 09/26/20 at 10:55 AM EDT by: MyChart video and verified that I am speaking with the correct person using two identifiers.  Patient is located at [facility] and provider is located at home.     The purpose of this virtual visit is to provide medical care while limiting exposure to the novel coronavirus. I discussed the limitations, risks, security and privacy concerns of performing an evaluation and management service by MyChart video and the availability of in person appointments. I also discussed with the patient that there may be a patient responsible charge related to this service. By engaging in this virtual visit, you consent to the provision of healthcare.  Additionally, you authorize for your insurance to be billed for the services provided during this visit.  The patient expressed understanding and agreed to proceed.  The following staff members participated in the virtual visit:  Venora Maples, MD/MPH Attending Family Medicine Physician, Faculty [facility] for [facility], [facility]     PRENATAL VISIT NOTE  Subjective:  Rebecca Serrano is a 35 y.o. G3P2002 at [redacted]w[redacted]d  for phone visit for ongoing prenatal care.  She is currently monitored for the following issues for this high-Serrano pregnancy and has Stress incontinence; Obesity, unspecified; Seasonal allergies; Low grade squamous intraepithelial lesion (LGSIL) on Papanicolaou smear of cervix; Dysplasia of cervix, low grade (CIN 1); Weight gain; Supervision of high Serrano pregnancy, antepartum; Chronic hypertension in obstetric context, third trimester; Advanced maternal age in multigravida; Obesity affecting pregnancy; and History of C-section on their problem list.  Patient reports  pelvic pressure .  Contractions: Not present. Vag. Bleeding: None.  Movement: Present. Denies leaking of fluid.   The following portions of the patient's history were reviewed and updated as  appropriate: allergies, current medications, past family history, past medical history, past social history, past surgical history and problem list.   Objective:   Vitals:   09/26/20 1124  BP: 115/73  Pulse: 88  Weight: 220 lb 4.8 oz (99.9 kg)   Self-Obtained  Fetal Status: Fetal Heart Rate (bpm): 141   Movement: Present     Assessment and Plan:  Pregnancy: G3P2002 at [redacted]w[redacted]d 1. Supervision of high Serrano pregnancy, antepartum BP and FHR normal  2. Chronic hypertension in obstetric context, third trimester Normotensive, no meds, taking ASA Normal growth [place], has follow up scheduled next week  3. History of C-section Elects for TOLAC  4. Obesity affecting pregnancy in third trimester Start antenatal testing at 36 weeks  5. Multigravida of advanced maternal age in third trimester   Preterm labor symptoms and general obstetric precautions including but not limited to vaginal bleeding, contractions, leaking of fluid and fetal movement were reviewed in detail with the patient.  Return in about 2 weeks (around 10/10/2020) for ob visit, HR[facility].  Future Appointments  Date Time Provider Department Center  10/02/2020  9:30 AM [facility] NURSE [facility]  10/02/2020  9:45 AM [facility] US5 [facility]  10/09/2020  8:55 AM Warden Fillers, MD [facility]     Time spent on virtual visit: 8 minutes  Venora Maples, MD

## 2020-10-02 ENCOUNTER — Ambulatory Visit: Payer: Self-pay | Admitting: *Deleted

## 2020-10-02 ENCOUNTER — Ambulatory Visit: Payer: Self-pay | Attending: Obstetrics and Gynecology

## 2020-10-02 ENCOUNTER — Other Ambulatory Visit: Payer: Self-pay

## 2020-10-02 ENCOUNTER — Encounter: Payer: Self-pay | Admitting: *Deleted

## 2020-10-02 ENCOUNTER — Other Ambulatory Visit: Payer: Self-pay | Admitting: Obstetrics and Gynecology

## 2020-10-02 VITALS — BP 128/68 | HR 90

## 2020-10-02 DIAGNOSIS — O09523 Supervision of elderly multigravida, third trimester: Secondary | ICD-10-CM | POA: Insufficient documentation

## 2020-10-02 DIAGNOSIS — O99213 Obesity complicating pregnancy, third trimester: Secondary | ICD-10-CM | POA: Insufficient documentation

## 2020-10-02 DIAGNOSIS — O34219 Maternal care for unspecified type scar from previous cesarean delivery: Secondary | ICD-10-CM

## 2020-10-02 DIAGNOSIS — Z3A34 34 weeks gestation of pregnancy: Secondary | ICD-10-CM

## 2020-10-02 DIAGNOSIS — E669 Obesity, unspecified: Secondary | ICD-10-CM

## 2020-10-02 DIAGNOSIS — O10913 Unspecified pre-existing hypertension complicating pregnancy, third trimester: Secondary | ICD-10-CM

## 2020-10-02 DIAGNOSIS — O10013 Pre-existing essential hypertension complicating pregnancy, third trimester: Secondary | ICD-10-CM

## 2020-10-02 DIAGNOSIS — O0990 Supervision of high risk pregnancy, unspecified, unspecified trimester: Secondary | ICD-10-CM | POA: Insufficient documentation

## 2020-10-02 DIAGNOSIS — Z98891 History of uterine scar from previous surgery: Secondary | ICD-10-CM

## 2020-10-02 DIAGNOSIS — Z362 Encounter for other antenatal screening follow-up: Secondary | ICD-10-CM

## 2020-10-02 DIAGNOSIS — O10019 Pre-existing essential hypertension complicating pregnancy, unspecified trimester: Secondary | ICD-10-CM

## 2020-10-09 ENCOUNTER — Encounter: Payer: Self-pay | Admitting: Obstetrics and Gynecology

## 2020-10-15 ENCOUNTER — Other Ambulatory Visit: Payer: Self-pay

## 2020-10-15 ENCOUNTER — Other Ambulatory Visit (HOSPITAL_COMMUNITY)
Admission: RE | Admit: 2020-10-15 | Discharge: 2020-10-15 | Disposition: A | Discharge status: Home / Self Care | Payer: Self-pay | Source: Ambulatory Visit | Attending: Family Medicine | Admitting: Family Medicine

## 2020-10-15 ENCOUNTER — Encounter: Payer: Self-pay | Admitting: Family Medicine

## 2020-10-15 ENCOUNTER — Ambulatory Visit (INDEPENDENT_AMBULATORY_CARE_PROVIDER_SITE_OTHER): Payer: Self-pay | Admitting: Family Medicine

## 2020-10-15 VITALS — BP 132/82 | HR 77 | Wt 222.7 lb

## 2020-10-15 DIAGNOSIS — O99213 Obesity complicating pregnancy, third trimester: Secondary | ICD-10-CM

## 2020-10-15 DIAGNOSIS — O0990 Supervision of high risk pregnancy, unspecified, unspecified trimester: Secondary | ICD-10-CM | POA: Insufficient documentation

## 2020-10-15 DIAGNOSIS — O10913 Unspecified pre-existing hypertension complicating pregnancy, third trimester: Secondary | ICD-10-CM

## 2020-10-15 DIAGNOSIS — N870 Mild cervical dysplasia: Secondary | ICD-10-CM

## 2020-10-15 DIAGNOSIS — Z98891 History of uterine scar from previous surgery: Secondary | ICD-10-CM

## 2020-10-15 DIAGNOSIS — O09523 Supervision of elderly multigravida, third trimester: Secondary | ICD-10-CM

## 2020-10-15 NOTE — Patient Instructions (Signed)

## 2020-10-15 NOTE — Progress Notes (Signed)
   Subjective:  Rebecca Serrano is a 35 y.o. G3P2002 at [redacted]w[redacted]d being seen today for ongoing prenatal care.  She is currently monitored for the following issues for this high-risk pregnancy and has Stress incontinence; Obesity, unspecified; Seasonal allergies; Low grade squamous intraepithelial lesion (LGSIL) on Papanicolaou smear of cervix; Dysplasia of cervix, low grade (CIN 1); Weight gain; Supervision of high risk pregnancy, antepartum; Chronic hypertension in obstetric context, third trimester; Advanced maternal age in multigravida; Obesity affecting pregnancy; and History of C-section on their problem list.  Patient reports no complaints.  Contractions: Not present. Vag. Bleeding: None.  Movement: Present. Denies leaking of fluid.   The following portions of the patient's history were reviewed and updated as appropriate: allergies, current medications, past family history, past medical history, past social history, past surgical history and problem list. Problem list updated.  Objective:   Vitals:   10/15/20 1121  BP: 132/82  Pulse: 77  Weight: 222 lb 11.2 oz (101 kg)    Fetal Status: Fetal Heart Rate (bpm): 144   Movement: Present  Presentation: Undeterminable  General:  Alert, oriented and cooperative. Patient is in no acute distress.  Skin: Skin is warm and dry. No rash noted.   Cardiovascular: Normal heart rate noted  Respiratory: Normal respiratory effort, no problems with respiration noted  Abdomen: Soft, gravid, appropriate for gestational age. Pain/Pressure: Present     Pelvic: Vag. Bleeding: None     Cervical exam performed Dilation: Closed Effacement (%): Thick Station: Ballotable  Extremities: Normal range of motion.  Edema: Trace  Mental Status: Normal mood and affect. Normal behavior. Normal judgment and thought content.   Urinalysis:      Assessment and Plan:  Pregnancy: G3P2002 at [redacted]w[redacted]d  1. Supervision of high risk pregnancy, antepartum BP and FHR normal Swabs  today Cervix checked, still closed  2. Chronic hypertension in obstetric context, third trimester Stable off meds  3. History of C-section Hx of VBAC x1, desires TOLAC Consent signed previously  4. Obesity affecting pregnancy in third trimester Starting antenatal testing this week  5. Multigravida of advanced maternal age in third trimester   6. Dysplasia of cervix, low grade (CIN 1) NILM/neg HPV 04/18/2020  Preterm labor symptoms and general obstetric precautions including but not limited to vaginal bleeding, contractions, leaking of fluid and fetal movement were reviewed in detail with the patient. Please refer to After Visit Summary for other counseling recommendations.  Return in 1 week (on 10/22/2020) for [facility], ob visit, needs MD.   Venora Maples, MD

## 2020-10-16 LAB — GC/CHLAMYDIA PROBE AMP (~~LOC~~) NOT AT ARMC
Chlamydia: NEGATIVE
Comment: NEGATIVE
Comment: NORMAL
Neisseria Gonorrhea: NEGATIVE

## 2020-10-19 LAB — CULTURE, BETA STREP (GROUP B ONLY): Strep Gp B Culture: NEGATIVE

## 2020-10-21 ENCOUNTER — Encounter: Payer: Self-pay | Admitting: Family Medicine

## 2020-10-21 ENCOUNTER — Other Ambulatory Visit: Payer: Self-pay

## 2020-10-21 ENCOUNTER — Ambulatory Visit (INDEPENDENT_AMBULATORY_CARE_PROVIDER_SITE_OTHER): Payer: Self-pay | Admitting: Family Medicine

## 2020-10-21 VITALS — BP 107/74 | HR 90 | Wt 222.9 lb

## 2020-10-21 DIAGNOSIS — O10913 Unspecified pre-existing hypertension complicating pregnancy, third trimester: Secondary | ICD-10-CM

## 2020-10-21 DIAGNOSIS — O99213 Obesity complicating pregnancy, third trimester: Secondary | ICD-10-CM

## 2020-10-21 DIAGNOSIS — Z98891 History of uterine scar from previous surgery: Secondary | ICD-10-CM

## 2020-10-21 DIAGNOSIS — O09523 Supervision of elderly multigravida, third trimester: Secondary | ICD-10-CM

## 2020-10-21 DIAGNOSIS — O0990 Supervision of high risk pregnancy, unspecified, unspecified trimester: Secondary | ICD-10-CM

## 2020-10-21 NOTE — Progress Notes (Signed)
   Subjective:  Rebecca Serrano is a 35 y.o. G3P2002 at [redacted]w[redacted]d being seen today for ongoing prenatal care.  She is currently monitored for the following issues for this high-risk pregnancy and has Stress incontinence; Obesity, unspecified; Seasonal allergies; Low grade squamous intraepithelial lesion (LGSIL) on Papanicolaou smear of cervix; Dysplasia of cervix, low grade (CIN 1); Weight gain; Supervision of high risk pregnancy, antepartum; Chronic hypertension in obstetric context, third trimester; Advanced maternal age in multigravida; Obesity affecting pregnancy; History of C-section; and Human papilloma virus infection on their problem list.  Patient reports no complaints.  Contractions: Irritability. Vag. Bleeding: None.  Movement: Present. Denies leaking of fluid.   The following portions of the patient's history were reviewed and updated as appropriate: allergies, current medications, past family history, past medical history, past social history, past surgical history and problem list. Problem list updated.  Objective:   Vitals:   10/21/20 0827  BP: 107/74  Pulse: 90  Weight: 222 lb 14.4 oz (101.1 kg)    Fetal Status: Fetal Heart Rate (bpm): NST   Movement: Present     General:  Alert, oriented and cooperative. Patient is in no acute distress.  Skin: Skin is warm and dry. No rash noted.   Cardiovascular: Normal heart rate noted  Respiratory: Normal respiratory effort, no problems with respiration noted  Abdomen: Soft, gravid, appropriate for gestational age. Pain/Pressure: Present     Pelvic: Vag. Bleeding: None     Cervical exam deferred        Extremities: Normal range of motion.  Edema: Trace  Mental Status: Normal mood and affect. Normal behavior. Normal judgment and thought content.   Urinalysis:      Assessment and Plan:  Pregnancy: G3P2002 at [redacted]w[redacted]d  1. Supervision of high risk pregnancy, antepartum BP normal, NST reactive  2. Chronic hypertension in obstetric context,  third trimester Normal BP's off meds throughout pregnancy  3. Multigravida of advanced maternal age in third trimester   4. Obesity affecting pregnancy in third trimester Needs to start weekly BPP but unfortunately no one available at this time due to call outs Will do NST x2 this week, NST reactive today  5. History of C-section S/p VBAC x1, TOLAC consent signed previously  Preterm labor symptoms and general obstetric precautions including but not limited to vaginal bleeding, contractions, leaking of fluid and fetal movement were reviewed in detail with the patient. Please refer to After Visit Summary for other counseling recommendations.  Return in 1 week (on 10/28/2020) for [facility], ob visit.   Venora Maples, MD

## 2020-10-21 NOTE — Patient Instructions (Signed)

## 2020-10-28 ENCOUNTER — Other Ambulatory Visit: Payer: Self-pay | Admitting: Advanced Practice Midwife

## 2020-10-28 ENCOUNTER — Ambulatory Visit (INDEPENDENT_AMBULATORY_CARE_PROVIDER_SITE_OTHER): Payer: Self-pay | Admitting: Family Medicine

## 2020-10-28 ENCOUNTER — Ambulatory Visit (INDEPENDENT_AMBULATORY_CARE_PROVIDER_SITE_OTHER): Payer: Self-pay

## 2020-10-28 ENCOUNTER — Encounter (HOSPITAL_COMMUNITY): Payer: Self-pay

## 2020-10-28 ENCOUNTER — Telehealth (HOSPITAL_COMMUNITY): Payer: Self-pay | Admitting: *Deleted

## 2020-10-28 ENCOUNTER — Other Ambulatory Visit: Payer: Self-pay

## 2020-10-28 ENCOUNTER — Encounter: Payer: Self-pay | Admitting: Family Medicine

## 2020-10-28 ENCOUNTER — Encounter (HOSPITAL_COMMUNITY): Payer: Self-pay | Admitting: *Deleted

## 2020-10-28 ENCOUNTER — Ambulatory Visit: Payer: Self-pay | Admitting: *Deleted

## 2020-10-28 VITALS — BP 120/66 | HR 80 | Wt 225.5 lb

## 2020-10-28 DIAGNOSIS — Z98891 History of uterine scar from previous surgery: Secondary | ICD-10-CM

## 2020-10-28 DIAGNOSIS — O99213 Obesity complicating pregnancy, third trimester: Secondary | ICD-10-CM

## 2020-10-28 DIAGNOSIS — O09523 Supervision of elderly multigravida, third trimester: Secondary | ICD-10-CM

## 2020-10-28 DIAGNOSIS — O0990 Supervision of high risk pregnancy, unspecified, unspecified trimester: Secondary | ICD-10-CM

## 2020-10-28 DIAGNOSIS — O10913 Unspecified pre-existing hypertension complicating pregnancy, third trimester: Secondary | ICD-10-CM

## 2020-10-28 NOTE — Patient Instructions (Signed)

## 2020-10-28 NOTE — Telephone Encounter (Signed)
Preadmission screen  

## 2020-10-28 NOTE — Progress Notes (Signed)
   Subjective:  Rebecca Serrano is a 35 y.o. G3P2002 at [redacted]w[redacted]d being seen today for ongoing prenatal care.  She is currently monitored for the following issues for this high-risk pregnancy and has Stress incontinence; Obesity, unspecified; Seasonal allergies; Low grade squamous intraepithelial lesion (LGSIL) on Papanicolaou smear of cervix; Dysplasia of cervix, low grade (CIN 1); Weight gain; Supervision of high risk pregnancy, antepartum; Chronic hypertension in obstetric context, third trimester; Advanced maternal age in multigravida; Obesity affecting pregnancy; History of C-section; and Human papilloma virus infection on their problem list.  Patient reports no complaints.  Contractions: Irregular. Vag. Bleeding: None.  Movement: Present. Denies leaking of fluid.   The following portions of the patient's history were reviewed and updated as appropriate: allergies, current medications, past family history, past medical history, past social history, past surgical history and problem list. Problem list updated.  Objective:   Vitals:   10/28/20 0910  BP: 120/66  Pulse: 80  Weight: 225 lb 8 oz (102.3 kg)    Fetal Status: Fetal Heart Rate (bpm): RNST   Movement: Present     General:  Alert, oriented and cooperative. Patient is in no acute distress.  Skin: Skin is warm and dry. No rash noted.   Cardiovascular: Normal heart rate noted  Respiratory: Normal respiratory effort, no problems with respiration noted  Abdomen: Soft, gravid, appropriate for gestational age. Pain/Pressure: Present     Pelvic: Vag. Bleeding: None     Cervical exam deferred        Extremities: Normal range of motion.  Edema: None  Mental Status: Normal mood and affect. Normal behavior. Normal judgment and thought content.   Urinalysis:      Assessment and Plan:  Pregnancy: G3P2002 at [redacted]w[redacted]d  1. Supervision of high risk pregnancy, antepartum Reactive NST today BP normal Discussed IOL, scheduled for [redacted]w[redacted]d on 11/06/20  after review of schedule IOL orders placed  2. Obesity affecting pregnancy in third trimester Antenatal testing weekly,. BPP 10/10 today Reassuring to date  3. Chronic hypertension in obstetric context, third trimester Normotensive off meds Last growth US unremarkable, has follow up in two days  4. Multigravida of advanced maternal age in third trimester   5. History of C-section Signed TOLAC consent previously  Term labor symptoms and general obstetric precautions including but not limited to vaginal bleeding, contractions, leaking of fluid and fetal movement were reviewed in detail with the patient. Please refer to After Visit Summary for other counseling recommendations.  Return in 1 week (on 11/04/2020) for [facility], ob visit.   Venora Maples, MD

## 2020-10-28 NOTE — Progress Notes (Signed)
[place] for growth scheduled on

## 2020-10-30 ENCOUNTER — Other Ambulatory Visit: Payer: Self-pay | Admitting: Obstetrics and Gynecology

## 2020-10-30 ENCOUNTER — Ambulatory Visit: Payer: Self-pay | Admitting: *Deleted

## 2020-10-30 ENCOUNTER — Other Ambulatory Visit: Payer: Self-pay

## 2020-10-30 ENCOUNTER — Ambulatory Visit: Payer: Self-pay | Attending: Obstetrics and Gynecology

## 2020-10-30 ENCOUNTER — Encounter: Payer: Self-pay | Admitting: *Deleted

## 2020-10-30 VITALS — BP 112/58 | HR 85

## 2020-10-30 DIAGNOSIS — O99213 Obesity complicating pregnancy, third trimester: Secondary | ICD-10-CM | POA: Insufficient documentation

## 2020-10-30 DIAGNOSIS — O09523 Supervision of elderly multigravida, third trimester: Secondary | ICD-10-CM | POA: Insufficient documentation

## 2020-10-30 DIAGNOSIS — O0990 Supervision of high risk pregnancy, unspecified, unspecified trimester: Secondary | ICD-10-CM | POA: Insufficient documentation

## 2020-10-30 DIAGNOSIS — O10913 Unspecified pre-existing hypertension complicating pregnancy, third trimester: Secondary | ICD-10-CM

## 2020-10-30 DIAGNOSIS — Z98891 History of uterine scar from previous surgery: Secondary | ICD-10-CM | POA: Insufficient documentation

## 2020-10-30 DIAGNOSIS — O368130 Decreased fetal movements, third trimester, not applicable or unspecified: Secondary | ICD-10-CM | POA: Insufficient documentation

## 2020-10-30 DIAGNOSIS — O10019 Pre-existing essential hypertension complicating pregnancy, unspecified trimester: Secondary | ICD-10-CM | POA: Insufficient documentation

## 2020-10-30 DIAGNOSIS — O10013 Pre-existing essential hypertension complicating pregnancy, third trimester: Secondary | ICD-10-CM

## 2020-10-30 DIAGNOSIS — Z3A38 38 weeks gestation of pregnancy: Secondary | ICD-10-CM

## 2020-10-30 DIAGNOSIS — O34219 Maternal care for unspecified type scar from previous cesarean delivery: Secondary | ICD-10-CM | POA: Insufficient documentation

## 2020-10-30 DIAGNOSIS — E669 Obesity, unspecified: Secondary | ICD-10-CM

## 2020-10-30 NOTE — Procedures (Signed)
Rebecca Serrano 04/13/1985 [redacted]w[redacted]d  Fetus A Non-Stress Test Interpretation for 10/30/20  Indication: Decreased Fetal Movement  Fetal Heart Rate A Mode: External Baseline Rate (A): 140 bpm Variability: Moderate Accelerations: 15 x 15 Decelerations: None Multiple birth?: No  Uterine Activity Mode: Palpation, Toco Contraction Frequency (min): Irreg Contraction Quality: Mild Resting Tone Palpated: Relaxed  Interpretation (Fetal Testing) Nonstress Test Interpretation: Reactive Comments: Dr. Parke Poisson reviewed tracing.

## 2020-10-30 NOTE — Progress Notes (Signed)
States has not felt fetal movement since 11:00am yesterday.

## 2020-10-30 NOTE — Progress Notes (Signed)
Dr. Parke Poisson notified that patient not feeling FM. NST started. + FHT's.

## 2020-10-31 ENCOUNTER — Other Ambulatory Visit: Payer: Self-pay | Admitting: Obstetrics and Gynecology

## 2020-10-31 DIAGNOSIS — O34219 Maternal care for unspecified type scar from previous cesarean delivery: Secondary | ICD-10-CM

## 2020-10-31 DIAGNOSIS — O09523 Supervision of elderly multigravida, third trimester: Secondary | ICD-10-CM

## 2020-10-31 DIAGNOSIS — O10019 Pre-existing essential hypertension complicating pregnancy, unspecified trimester: Secondary | ICD-10-CM

## 2020-10-31 DIAGNOSIS — O99213 Obesity complicating pregnancy, third trimester: Secondary | ICD-10-CM

## 2020-11-03 ENCOUNTER — Ambulatory Visit: Payer: Self-pay | Admitting: *Deleted

## 2020-11-03 ENCOUNTER — Other Ambulatory Visit: Payer: Self-pay

## 2020-11-03 ENCOUNTER — Ambulatory Visit (INDEPENDENT_AMBULATORY_CARE_PROVIDER_SITE_OTHER): Payer: Self-pay | Admitting: Obstetrics and Gynecology

## 2020-11-03 ENCOUNTER — Ambulatory Visit (INDEPENDENT_AMBULATORY_CARE_PROVIDER_SITE_OTHER): Payer: Self-pay

## 2020-11-03 ENCOUNTER — Encounter: Payer: Self-pay | Admitting: Obstetrics and Gynecology

## 2020-11-03 VITALS — BP 118/71 | HR 79 | Wt 226.3 lb

## 2020-11-03 DIAGNOSIS — O10913 Unspecified pre-existing hypertension complicating pregnancy, third trimester: Secondary | ICD-10-CM

## 2020-11-03 DIAGNOSIS — Z3A38 38 weeks gestation of pregnancy: Secondary | ICD-10-CM

## 2020-11-03 DIAGNOSIS — O99213 Obesity complicating pregnancy, third trimester: Secondary | ICD-10-CM

## 2020-11-03 DIAGNOSIS — O0990 Supervision of high risk pregnancy, unspecified, unspecified trimester: Secondary | ICD-10-CM

## 2020-11-03 DIAGNOSIS — O09523 Supervision of elderly multigravida, third trimester: Secondary | ICD-10-CM

## 2020-11-03 DIAGNOSIS — Z98891 History of uterine scar from previous surgery: Secondary | ICD-10-CM

## 2020-11-03 NOTE — Progress Notes (Signed)

## 2020-11-03 NOTE — Progress Notes (Signed)
Subjective:  Rebecca Serrano is a 35 y.o. G3P2002 at [redacted]w[redacted]d being seen today for ongoing prenatal care.  She is currently monitored for the following issues for this high-risk pregnancy and has Stress incontinence; Obesity, unspecified; Seasonal allergies; Low grade squamous intraepithelial lesion (LGSIL) on Papanicolaou smear of cervix; Dysplasia of cervix, low grade (CIN 1); Weight gain; Supervision of high risk pregnancy, antepartum; Chronic hypertension in obstetric context, third trimester; Advanced maternal age in multigravida; Obesity affecting pregnancy; History of C-section; and Human papilloma virus infection on their problem list.  Patient reports general discomforts of pregnancy.  Contractions: Irregular. Vag. Bleeding: None.  Movement: Present. Denies leaking of fluid.   The following portions of the patient's history were reviewed and updated as appropriate: allergies, current medications, past family history, past medical history, past social history, past surgical history and problem list. Problem list updated.  Objective:   Vitals:   11/03/20 1525  BP: 118/71  Pulse: 79  Weight: 226 lb 4.8 oz (102.6 kg)    Fetal Status: Fetal Heart Rate (bpm): RNST   Movement: Present     General:  Alert, oriented and cooperative. Patient is in no acute distress.  Skin: Skin is warm and dry. No rash noted.   Cardiovascular: Normal heart rate noted  Respiratory: Normal respiratory effort, no problems with respiration noted  Abdomen: Soft, gravid, appropriate for gestational age. Pain/Pressure: Present     Pelvic:  Cervical exam deferred        Extremities: Normal range of motion.  Edema: None  Mental Status: Normal mood and affect. Normal behavior. Normal judgment and thought content.   Urinalysis:      Assessment and Plan:  Pregnancy: G3P2002 at [redacted]w[redacted]d  1. Chronic hypertension in obstetric context, third trimester Stable BPP/NST today  2. Supervision of high risk pregnancy,  antepartum IOL this Thursday  3. History of C-section For TOLAC  4. Obesity affecting pregnancy in third trimester   5. Multigravida of advanced maternal age in third trimester   Term labor symptoms and general obstetric precautions including but not limited to vaginal bleeding, contractions, leaking of fluid and fetal movement were reviewed in detail with the patient. Please refer to After Visit Summary for other counseling recommendations.  Return for IOL Scheduled on 9/29.   Hermina Staggers, MD

## 2020-11-03 NOTE — Patient Instructions (Signed)
Vaginal Delivery ?Vaginal delivery means that you give birth by pushing your baby out of your birth canal (vagina). Your health care team will help you before, during, and after vaginal delivery. ?Birth experiences are unique for every woman and every pregnancy, and birth experiences vary depending on where you choose to give birth. ?What are the risks and benefits? ?Generally, this is safe. However, problems may occur, including: ?Bleeding. ?Infection. ?Damage to other structures such as vaginal tearing. ?Allergic reactions to medicines. ?Despite the risks, benefits of vaginal delivery include less risk of bleeding and infection and a shorter recovery time compared to a Cesarean delivery. Cesarean delivery, or C-section, is the surgical delivery of a baby. ?What happens when I arrive at the birth center or hospital? ?Once you are in labor and have been admitted into the hospital or birth center, your health care team may: ?Review your pregnancy history and any concerns that you have. ?Talk with you about your birth plan and discuss pain control options. ?Check your blood pressure, breathing, and heartbeat. ?Assess your baby's heartbeat. ?Monitor your uterus for contractions. ?Check whether your bag of water (amniotic sac) has broken (ruptured). ?Insert an IV into one of your veins. This may be used to give you fluids and medicines. ?Monitoring ?Your health care team may assess your contractions (uterine monitoring) and your baby's heart rate (fetal monitoring). You may need to be monitored: ?Often, but not continuously (intermittently). ?All the time or for long periods at a time (continuously). Continuous monitoring may be needed if: ?You are taking certain medicines, such as medicine to relieve pain or make your contractions stronger. ?You have pregnancy or labor complications. ?Monitoring may be done by: ?Placing a special stethoscope or a handheld monitoring device on your abdomen to check your baby's heartbeat  and to check for contractions. ?Placing monitors on your abdomen (external monitors) to record your baby's heartbeat and the frequency and length of contractions. ?Placing monitors inside your uterus through your vagina (internal monitors) to record your baby's heartbeat and the frequency, length, and strength of your contractions. Depending on the type of monitor, it may remain in your uterus or on your baby's head until birth. ?Telemetry. This is a type of continuous monitoring that can be done with external or internal monitors. Instead of having to stay in bed, you are able to move around. ?Physical exam ?Your health care team may perform frequent physical exams. This may include: ?Checking how and where your baby is positioned in your uterus. ?Checking your cervix to determine: ?Whether it is thinning out (effacing). ?Whether it is opening up (dilating). ?What happens during labor and delivery? ?Normal labor and delivery is divided into the following three stages: ?Stage 1 ?This is the longest stage of labor. ?Throughout this stage, you will feel contractions. Contractions generally feel mild, infrequent, and irregular at first. They get stronger, more frequent, and more regular as you move through this stage. You may have contractions about every 2-3 minutes. ?This stage ends when your cervix is completely dilated to 4 inches (10 cm) and completely effaced. ?Stage 2 ?This stage starts once your cervix is completely effaced and dilated and lasts until the delivery of your baby. ?This is the stage where you will feel an urge to push your baby out of your vagina. ?You may feel stretching and burning pain, especially when the widest part of your baby's head passes through the vaginal opening (crowning). ?Once your baby is delivered, the umbilical cord will be clamped and   cut. Timing of cutting the cord will depend on your wishes, your baby's health, and your health care provider's practices. ?Your baby will be  placed on your bare chest (skin-to-skin contact) in an upright position and covered with a warm blanket. If you are choosing to breastfeed, watch your baby for feeding cues, like rooting or sucking, and help the baby to your breast for his or her first feeding. ?Stage 3 ?This stage starts immediately after the birth of your baby and ends after you deliver the placenta. ?This stage may take anywhere from 5 to 30 minutes. ?After your baby has been delivered, you will feel contractions as your body expels the placenta. These contractions also help your uterus get smaller and reduce bleeding. ?What can I expect after labor and delivery? ?After labor is over, you and your baby will be assessed closely until you are ready to go home. Your health care team will teach you how to care for yourself and your baby. ?You and your baby may be encouraged to stay in the same room (rooming in) during your hospital stay. This will help promote early bonding and successful breastfeeding. ?Your uterus will be checked and massaged regularly (fundal massage). ?You may continue to receive fluids and medicines through an IV. ?You will have some soreness and pain in your abdomen, vagina, and the area of skin between your vaginal opening and your anus (perineum). ?If an incision was made near your vagina (episiotomy) or if you had some vaginal tearing during delivery, cold compresses may be placed on your episiotomy or your tear. This helps to reduce pain and swelling. ?It is normal to have vaginal bleeding after delivery. Wear a sanitary pad for vaginal bleeding and discharge. ?Summary ?Vaginal delivery means that you will give birth by pushing your baby out of your birth canal (vagina). ?Your health care team will monitor you and your baby throughout the stages of labor. ?After you deliver your baby, your health care team will continue to assess you and your baby to ensure you are both recovering as expected after delivery. ?This  information is not intended to replace advice given to you by your health care provider. Make sure you discuss any questions you have with your health care provider. ?Document Revised: 12/24/2019 Document Reviewed: 12/24/2019 ?Elsevier Patient Education ? 2022 [employer] ? ?

## 2020-11-04 ENCOUNTER — Other Ambulatory Visit: Payer: Self-pay | Admitting: Family Medicine

## 2020-11-04 LAB — SARS CORONAVIRUS 2 (TAT 6-24 HRS): SARS Coronavirus 2: NEGATIVE

## 2020-11-06 ENCOUNTER — Inpatient Hospital Stay (HOSPITAL_COMMUNITY)
Admission: AD | Admit: 2020-11-06 | Discharge: 2020-11-09 | DRG: 807 | Disposition: A | Discharge status: Home / Self Care | Payer: Medicaid Other | Attending: Obstetrics and Gynecology | Admitting: Obstetrics and Gynecology

## 2020-11-06 ENCOUNTER — Inpatient Hospital Stay (HOSPITAL_COMMUNITY): Payer: Medicaid Other

## 2020-11-06 ENCOUNTER — Encounter (HOSPITAL_COMMUNITY): Payer: Self-pay | Admitting: Family Medicine

## 2020-11-06 DIAGNOSIS — W1839XA Other fall on same level, initial encounter: Secondary | ICD-10-CM | POA: Diagnosis present

## 2020-11-06 DIAGNOSIS — O1002 Pre-existing essential hypertension complicating childbirth: Principal | ICD-10-CM | POA: Diagnosis present

## 2020-11-06 DIAGNOSIS — O34211 Maternal care for low transverse scar from previous cesarean delivery: Secondary | ICD-10-CM | POA: Diagnosis present

## 2020-11-06 DIAGNOSIS — Z3A39 39 weeks gestation of pregnancy: Secondary | ICD-10-CM

## 2020-11-06 DIAGNOSIS — E669 Obesity, unspecified: Secondary | ICD-10-CM | POA: Diagnosis present

## 2020-11-06 DIAGNOSIS — Z98891 History of uterine scar from previous surgery: Secondary | ICD-10-CM

## 2020-11-06 DIAGNOSIS — O26893 Other specified pregnancy related conditions, third trimester: Secondary | ICD-10-CM | POA: Diagnosis present

## 2020-11-06 DIAGNOSIS — O10919 Unspecified pre-existing hypertension complicating pregnancy, unspecified trimester: Secondary | ICD-10-CM | POA: Diagnosis present

## 2020-11-06 DIAGNOSIS — Y92230 Patient room in hospital as the place of occurrence of the external cause: Secondary | ICD-10-CM | POA: Diagnosis present

## 2020-11-06 DIAGNOSIS — O99214 Obesity complicating childbirth: Secondary | ICD-10-CM | POA: Diagnosis present

## 2020-11-06 LAB — PROTEIN / CREATININE RATIO, URINE
Creatinine, Urine: 50.24 mg/dL
Total Protein, Urine: <6 mg/dL

## 2020-11-06 LAB — CBC
HCT: 38.8 % (ref 36.0–46.0)
Hemoglobin: 13 g/dL (ref 12.0–15.0)
MCH: 30.4 pg (ref 26.0–34.0)
MCHC: 33.5 g/dL (ref 30.0–36.0)
MCV: 90.7 fL (ref 80.0–100.0)
Platelets: 251 10*3/uL (ref 150–400)
RBC: 4.28 MIL/uL (ref 3.87–5.11)
RDW: 13.8 % (ref 11.5–15.5)
WBC: 10 10*3/uL (ref 4.0–10.5)
nRBC: 0 % (ref 0.0–0.2)

## 2020-11-06 LAB — COMPREHENSIVE METABOLIC PANEL
ALT: 18 U/L (ref 0–44)
AST: 30 U/L (ref 15–41)
Albumin: 2.7 g/dL — ABNORMAL LOW (ref 3.5–5.0)
Alkaline Phosphatase: 159 U/L — ABNORMAL HIGH (ref 38–126)
Anion gap: 10 (ref 5–15)
BUN: 9 mg/dL (ref 6–20)
CO2: 20 mmol/L — ABNORMAL LOW (ref 22–32)
Calcium: 9.7 mg/dL (ref 8.9–10.3)
Chloride: 104 mmol/L (ref 98–111)
Creatinine, Ser: 0.66 mg/dL (ref 0.44–1.00)
GFR, Estimated: >60 mL/min (ref >60)
Glucose, Bld: 82 mg/dL (ref 70–99)
Potassium: 3.7 mmol/L (ref 3.5–5.1)
Sodium: 134 mmol/L — ABNORMAL LOW (ref 135–145)
Total Bilirubin: 0.2 mg/dL — ABNORMAL LOW (ref 0.3–1.2)
Total Protein: 6.6 g/dL (ref 6.5–8.1)

## 2020-11-06 LAB — TYPE AND SCREEN
ABO/RH(D): O POS
Antibody Screen: NEGATIVE

## 2020-11-06 MED ORDER — FENTANYL CITRATE (PF) 100 MCG/2ML IJ SOLN: 50.0000 ug | INTRAMUSCULAR | Status: DC | PRN

## 2020-11-06 MED ORDER — ONDANSETRON HCL 4 MG/2ML IJ SOLN: 4.0000 mg | Freq: Four times a day (QID) | INTRAMUSCULAR | Status: DC | PRN

## 2020-11-06 MED ORDER — OXYTOCIN-SODIUM CHLORIDE 30-0.9 UT/500ML-% IV SOLN
2.5000 [IU]/h | INTRAVENOUS | Status: DC
  Administered 2020-11-07: 2.5 [IU]/h via INTRAVENOUS

## 2020-11-06 MED ORDER — OXYTOCIN-SODIUM CHLORIDE 30-0.9 UT/500ML-% IV SOLN
1.0000 m[IU]/min | INTRAVENOUS | Status: DC
  Administered 2020-11-06: 2 m[IU]/min via INTRAVENOUS
  Filled 2020-11-06 (×2): qty 500

## 2020-11-06 MED ORDER — TERBUTALINE SULFATE 1 MG/ML IJ SOLN: 0.2500 mg | Freq: Once | INTRAMUSCULAR | Status: DC | PRN

## 2020-11-06 MED ORDER — LIDOCAINE HCL (PF) 1 % IJ SOLN: 30.0000 mL | INTRAMUSCULAR | Status: DC | PRN

## 2020-11-06 MED ORDER — ACETAMINOPHEN 325 MG PO TABS: 650.0000 mg | ORAL_TABLET | ORAL | Status: DC | PRN

## 2020-11-06 MED ORDER — LACTATED RINGERS IV SOLN: INTRAVENOUS | Status: DC

## 2020-11-06 MED ORDER — SOD CITRATE-CITRIC ACID 500-334 MG/5ML PO SOLN
30.0000 mL | ORAL | Status: DC | PRN
  Administered 2020-11-07: 15 mL via ORAL
  Filled 2020-11-06: qty 30

## 2020-11-06 MED ORDER — LACTATED RINGERS IV SOLN
500.0000 mL | INTRAVENOUS | Status: DC | PRN
Start: 2020-11-06 — End: 2020-11-08

## 2020-11-06 MED ORDER — OXYTOCIN BOLUS FROM INFUSION
333.0000 mL | Freq: Once | INTRAVENOUS | Status: AC
  Administered 2020-11-07: 333 mL via INTRAVENOUS

## 2020-11-06 NOTE — H&P (Signed)
OBSTETRIC ADMISSION HISTORY AND PHYSICAL  Rebecca Serrano is a 35 y.o. female G48P2002 with IUP at [redacted]w[redacted]d by 10wk [place] presenting for IOL. She reports +FMs, no LOF, no VB, no blurry vision, or headaches, stable peripheral edema, and no RUQ pain. She plans on breast and bottle feeding. She requests OCPs for birth control postpartum. She requests epidural for pain control in labor.   She received her prenatal care at [facility].   Dating: By 10wk [place] --->  Estimated Date of Delivery: 11/12/20  Sono:   @[redacted]w[redacted]d , CWD, normal anatomy, cephalic presentation, placenta anterior, 3326g, 56% EFW  Prenatal History/Complications:  Chronic HTN AMA Obesity Prior C-section x1 for failure to progress/NRFHT followed by VBAC x1   Past Medical History: Past Medical History:  Diagnosis Date   Hypertension    No pertinent past medical history    Vaginal Pap smear, abnormal     Past Surgical History: Past Surgical History:  Procedure Laterality Date   CESAREAN SECTION      Obstetrical History: OB History     Gravida  3   Para  2   Term  2   Preterm  0   AB  0   Living  2      SAB  0   IAB  0   Ectopic  0   Multiple  0   Live Births  2           Social History Social History   Socioeconomic History   Marital status: Single    Spouse name: Not on file   Number of children: Not on file   Years of education: Not on file   Highest education level: Not on file  Occupational History   Not on file  Tobacco Use   Smoking status: Never   Smokeless tobacco: Never  Vaping Use   Vaping Use: Never used  Substance and Sexual Activity   Alcohol use: No   Drug use: No   Sexual activity: Yes    Comment: Was on OCP when got pregnant. Last intercourse 1 wk. ago  Other Topics Concern   Not on file  Social History Narrative   Not on file   Social Determinants of Health   Financial Resource Strain: Not on file  Food Insecurity: No Food Insecurity   Worried About in the Last Year: Never true   Ran Out of Food in the Last Year: Never true  Transportation Needs: No Transportation Needs   Lack of Transportation (Medical): No   Lack of Transportation (Non-Medical): No  Physical Activity: Not on file  Stress: Not on file  Social Connections: Not on file    Family History: Family History  Problem Relation Age of Onset   Diabetes Father    Cancer Father    Hyperlipidemia Father    Drug abuse Paternal Aunt    Drug abuse Paternal Uncle    Early death Paternal Grandfather    Other Neg Hx     Allergies: No Known Allergies  Medications Prior to Admission  Medication Sig Dispense Refill Last Dose   aspirin EC 81 MG tablet Take 81 mg by mouth daily. Swallow whole.   11/05/2020   Prenatal Vit-Fe Fumarate-FA (PRENATAL MULTIVITAMIN) TABS tablet Take 1 tablet by mouth daily at 12 noon. 90 tablet 1 11/06/2020   famotidine (PEPCID) 20 MG tablet Take 1 tablet (20 mg total) by mouth 2 (two) times daily. 60 tablet 3  Review of Systems  All systems reviewed and negative except as stated in HPI  Blood pressure 112/74, pulse 69, temperature 97.8 F (36.6 C), temperature source Oral, resp. rate 17, height 5\' 1"  (1.549 m), weight 104.1 kg, last menstrual period 02/13/2020.  General appearance: alert, cooperative, and no distress Lungs: normal work of breathing on room air  Abdomen: soft, non-tender, gravid Extremities: no LE edema or calf tenderness to palpation   Presentation: cephalic Fetal monitoring: Baseline 135 bpm, moderate variability, + accels, no decels  Uterine activity: Occasional contractions Dilation: 1.5 Effacement (%): Thick Station: Ballotable Exam by:: Dr [email or web address]   Prenatal labs: ABO, Rh: --/--/O POS (09/29 1630) Antibody: NEG (09/29 1630) Rubella: 16.30 (03/01 1027) RPR: Non Reactive (07/22 0837)  HBsAg: Negative (03/01 1027)  HIV: Non Reactive (07/22 0837)  GBS: Negative/-- (09/07 1333)  1 hr glucola 158, 3 hr  glucola normal Genetic screening declined Anatomy 10-19-1978 normal  Prenatal Transfer Tool  Maternal Diabetes: No Genetic Screening: Declined Maternal Ultrasounds/Referrals: Normal Fetal Ultrasounds or other Referrals:  None Maternal Substance Abuse:  No Significant Maternal Medications:  None Significant Maternal Lab Results: Group B Strep negative  Results for orders placed or performed during the hospital encounter of 11/06/20 (from the past 24 hour(s))  CBC   Collection Time: 11/06/20  4:30 PM  Result Value Ref Range   WBC 10.0 4.0 - 10.5 K/uL   RBC 4.28 3.87 - 5.11 MIL/uL   Hemoglobin 13.0 12.0 - 15.0 g/dL   HCT 11/08/20 33.3 - 54.5 %   MCV 90.7 80.0 - 100.0 fL   MCH 30.4 26.0 - 34.0 pg   MCHC 33.5 30.0 - 36.0 g/dL   RDW 62.5 63.8 - 93.7 %   Platelets 251 150 - 400 K/uL   nRBC 0.0 0.0 - 0.2 %  Comprehensive metabolic panel   Collection Time: 11/06/20  4:30 PM  Result Value Ref Range   Sodium 134 (L) 135 - 145 mmol/L   Potassium 3.7 3.5 - 5.1 mmol/L   Chloride 104 98 - 111 mmol/L   CO2 20 (L) 22 - 32 mmol/L   Glucose, Bld 82 70 - 99 mg/dL   BUN 9 6 - 20 mg/dL   Creatinine, Ser 11/08/20 0.44 - 1.00 mg/dL   Calcium 9.7 8.9 - 8.76 mg/dL   Total Protein 6.6 6.5 - 8.1 g/dL   Albumin 2.7 (L) 3.5 - 5.0 g/dL   AST 30 15 - 41 U/L   ALT 18 0 - 44 U/L   Alkaline Phosphatase 159 (H) 38 - 126 U/L   Total Bilirubin 0.2 (L) 0.3 - 1.2 mg/dL   GFR, Estimated 81.1 >57 mL/min   Anion gap 10 5 - 15  Type and screen   Collection Time: 11/06/20  4:30 PM  Result Value Ref Range   ABO/RH(D) O POS    Antibody Screen NEG    Sample Expiration      11/09/2020,2359 Performed at [facility], 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 G3P2002 at [redacted]w[redacted]d here for induction of labor for cHTN / TOLAC.   #Labor: SVE 1.5/thick/ballotable. FB placed. Will start Pitocin 2x2 and reassess in 4 hours. Plan for AROM once FB dislodges.  #cHTN: Normotensive without anti-hypertensive medications. BP WNL at 128/67 on arrival. CBC, CMP, urine protein/creatinine ordered.  #Pain: PRN; planning for epidural  #FWB: Cat 1  #ID:  GBS neg  #MOF: Breast and bottle #MOC: POPs  #TOLAC: Hx of CS in G1 for failure to progress and NRFHT in 2006. Successful VBAC in 2013. Consent for TOLAC signed 08/29/20. Risks/benefits reviewed again upon admission and patient would like to proceed with TOLAC.  Evalina Field, MD 11/06/20 6:33 PM

## 2020-11-06 NOTE — Progress Notes (Signed)
Labor Progress Note Rebecca Serrano is a 35 y.o. G3P2002 at [redacted]w[redacted]d presented for IOL due to [place].   S: Doing well. Starting to feel contractions more, uncomfortable when they occur but managing well.   O:  BP (!) 106/52   Pulse 76   Temp 97.7 F (36.5 C) (Oral)   Resp 16   Ht 5\' 1"  (1.549 m)   Wt 104.1 kg   LMP 02/13/2020 (Approximate)   BMI 43.34 kg/m  EFM: 135/mod/15x15/no decels   CVE: Dilation: 1.5 Effacement (%): Thick Station: Ballotable Presentation: Vertex Exam by:: Dr [email or web address]   [name]: 35 y.o. 31 [redacted]w[redacted]d #Labor: Pulled on FB, still in place. Cont with pit, hopefully FB will fall out soon.  #Pain: Planning on epidural after FB comes out.  #FWB: Cat 1  #GBS negative  #TOLAC: S/p 1 successful VBAC. Hopefully for second.   #cHTN: BP WNL since arrival. Asymptomatic. Pre-e labs WNL. Cont to monitor.   [redacted]w[redacted]d, DO 9:13 PM

## 2020-11-07 ENCOUNTER — Inpatient Hospital Stay (HOSPITAL_COMMUNITY): Payer: Medicaid Other | Admitting: Anesthesiology

## 2020-11-07 ENCOUNTER — Encounter (HOSPITAL_COMMUNITY): Payer: Self-pay | Admitting: Family Medicine

## 2020-11-07 DIAGNOSIS — O1002 Pre-existing essential hypertension complicating childbirth: Secondary | ICD-10-CM | POA: Diagnosis not present

## 2020-11-07 DIAGNOSIS — Z3A39 39 weeks gestation of pregnancy: Secondary | ICD-10-CM

## 2020-11-07 DIAGNOSIS — O34211 Maternal care for low transverse scar from previous cesarean delivery: Secondary | ICD-10-CM | POA: Diagnosis not present

## 2020-11-07 LAB — RPR: RPR Ser Ql: NONREACTIVE

## 2020-11-07 LAB — CBC
HCT: 37.4 % (ref 36.0–46.0)
HCT: 40.5 % (ref 36.0–46.0)
Hemoglobin: 12.5 g/dL (ref 12.0–15.0)
Hemoglobin: 13.3 g/dL (ref 12.0–15.0)
MCH: 30.3 pg (ref 26.0–34.0)
MCH: 30.1 pg (ref 26.0–34.0)
MCHC: 33.4 g/dL (ref 30.0–36.0)
MCHC: 32.8 g/dL (ref 30.0–36.0)
MCV: 90.6 fL (ref 80.0–100.0)
MCV: 91.6 fL (ref 80.0–100.0)
Platelets: 225 10*3/uL (ref 150–400)
Platelets: 212 10*3/uL (ref 150–400)
RBC: 4.13 MIL/uL (ref 3.87–5.11)
RBC: 4.42 MIL/uL (ref 3.87–5.11)
RDW: 13.7 % (ref 11.5–15.5)
RDW: 13.8 % (ref 11.5–15.5)
WBC: 13.9 10*3/uL — ABNORMAL HIGH (ref 4.0–10.5)
WBC: 16.5 10*3/uL — ABNORMAL HIGH (ref 4.0–10.5)
nRBC: 0 % (ref 0.0–0.2)
nRBC: 0 % (ref 0.0–0.2)

## 2020-11-07 MED ORDER — DIPHENHYDRAMINE HCL 50 MG/ML IJ SOLN
12.5000 mg | INTRAMUSCULAR | Status: DC | PRN
Start: 2020-11-07 — End: 2020-11-08

## 2020-11-07 MED ORDER — FAMOTIDINE IN NACL 20-0.9 MG/50ML-% IV SOLN
20.0000 mg | Freq: Two times a day (BID) | INTRAVENOUS | Status: DC
  Administered 2020-11-07 (×2): 20 mg via INTRAVENOUS
  Filled 2020-11-07 (×2): qty 50

## 2020-11-07 MED ORDER — FENTANYL-BUPIVACAINE-NACL 0.5-0.125-0.9 MG/250ML-% EP SOLN
12.0000 mL/h | EPIDURAL | Status: DC | PRN
  Filled 2020-11-07 (×2): qty 250

## 2020-11-07 MED ORDER — EPHEDRINE 5 MG/ML INJ: 10.0000 mg | INTRAVENOUS | Status: DC | PRN

## 2020-11-07 MED ORDER — LIDOCAINE HCL (PF) 1 % IJ SOLN
INTRAMUSCULAR | Status: DC | PRN
  Administered 2020-11-07: 10 mL via EPIDURAL
  Administered 2020-11-07: 2 mL via EPIDURAL

## 2020-11-07 MED ORDER — LACTATED RINGERS IV SOLN: 500.0000 mL | Freq: Once | INTRAVENOUS | Status: DC

## 2020-11-07 MED ORDER — FENTANYL-BUPIVACAINE-NACL 0.5-0.125-0.9 MG/250ML-% EP SOLN
EPIDURAL | Status: DC | PRN
  Administered 2020-11-07: 12 mL/h via EPIDURAL
  Administered 2020-11-07: 500 ug via EPIDURAL

## 2020-11-07 MED ORDER — PHENYLEPHRINE 40 MCG/ML (10ML) SYRINGE FOR IV PUSH (FOR BLOOD PRESSURE SUPPORT): 80.0000 ug | PREFILLED_SYRINGE | INTRAVENOUS | Status: DC | PRN

## 2020-11-07 NOTE — Anesthesia Procedure Notes (Signed)
Epidural Patient location during procedure: OB Start time: 11/07/2020 5:10 AM End time: 11/07/2020 5:22 AM  Staffing Anesthesiologist: Lannie Fields, DO Performed: anesthesiologist   Preanesthetic Checklist Completed: patient identified, IV checked, risks and benefits discussed, monitors and equipment checked, pre-op evaluation and timeout performed  Epidural Patient position: sitting Prep: DuraPrep and site prepped and draped Patient monitoring: continuous pulse ox, blood pressure, heart rate and cardiac monitor Approach: midline Location: L3-L4 Injection technique: LOR air  Needle:  Needle type: Tuohy  Needle gauge: 17 G Needle length: 9 cm Needle insertion depth: 8 cm Catheter type: closed end flexible Catheter size: 19 Gauge Catheter at skin depth: 13 cm Test dose: negative  Assessment Sensory level: T8 Events: blood not aspirated, injection not painful, no injection resistance, no paresthesia and negative IV test  Additional Notes Patient identified. Risks/Benefits/Options discussed with patient including but not limited to bleeding, infection, nerve damage, paralysis, failed block, incomplete pain control, headache, blood pressure changes, nausea, vomiting, reactions to medication both or allergic, itching and postpartum back pain. Confirmed with bedside nurse the patient's most recent platelet count. Confirmed with patient that they are not currently taking any anticoagulation, have any bleeding history or any family history of bleeding disorders. Patient expressed understanding and wished to proceed. All questions were answered. Sterile technique was used throughout the entire procedure. Please see nursing notes for vital signs. Test dose was given through epidural catheter and negative prior to continuing to dose epidural or start infusion. Warning signs of high block given to the patient including shortness of breath, tingling/numbness in hands, complete motor  block, or any concerning symptoms with instructions to call for help. Patient was given instructions on fall risk and not to get out of bed. All questions and concerns addressed with instructions to call with any issues or inadequate analgesia.  Reason for block:procedure for pain

## 2020-11-07 NOTE — Progress Notes (Signed)
Labor Progress Note ELINORA WEIGAND is a 35 y.o. G3P2002 at [redacted]w[redacted]d presented for IOL due to [place].   S: Doing well. Just got epidural, no longer feeling contractions.   O:  BP 109/63   Pulse 66   Temp 97.7 F (36.5 C) (Oral)   Resp 16   Ht 5\' 1"  (1.549 m)   Wt 104.1 kg   LMP 02/13/2020 (Approximate)   BMI 43.34 kg/m  EFM: 140/mod/15x15/no decels   CVE: Dilation: 5 Effacement (%): 50, 60 Station: -3 Presentation: Vertex Exam by:: [email or web address], DO   [name]: 35 y.o. 31 [redacted]w[redacted]d  #Labor: Progressing. After verbal consent, performed AROM with moderate amount of clear fluid. Head well applied following AROM. Cont pit 2x2.  #Pain: Epidural  #FWB: Cat 1 #GBS negative   #cHTN: BP Wnl, actually on the lower side since arrival. Cont to monitor.   [redacted]w[redacted]d, DO 6:09 AM

## 2020-11-07 NOTE — Anesthesia Preprocedure Evaluation (Signed)
Anesthesia Evaluation  Patient identified by MRN, date of birth, ID band Patient awake    Reviewed: Allergy & Precautions, Patient's Chart, lab work & pertinent test results  Airway Mallampati: II  TM Distance: >3 FB Neck ROM: Full    Dental no notable dental hx.    Pulmonary neg pulmonary ROS,    Pulmonary exam normal breath sounds clear to auscultation       Cardiovascular hypertension (gest HTN), Normal cardiovascular exam Rhythm:Regular Rate:Normal     Neuro/Psych negative neurological ROS  negative psych ROS   GI/Hepatic Neg liver ROS, GERD  Medicated and Controlled,  Endo/Other  Morbid obesityBMI 43  Renal/GU negative Renal ROS  negative genitourinary   Musculoskeletal negative musculoskeletal ROS (+)   Abdominal (+) + obese,   Peds negative pediatric ROS (+)  Hematology negative hematology ROS (+) hct 37.4, plt 225   Anesthesia Other Findings   Reproductive/Obstetrics (+) Pregnancy                             Anesthesia Physical Anesthesia Plan  ASA: 3  Anesthesia Plan: Epidural   Post-op Pain Management:    Induction:   PONV Risk Score and Plan: 2  Airway Management Planned: Natural Airway  Additional Equipment: None  Intra-op Plan:   Post-operative Plan:   Informed Consent: I have reviewed the patients History and Physical, chart, labs and discussed the procedure including the risks, benefits and alternatives for the proposed anesthesia with the patient or authorized representative who has indicated his/her understanding and acceptance.       Plan Discussed with:   Anesthesia Plan Comments:         Anesthesia Quick Evaluation

## 2020-11-07 NOTE — Progress Notes (Signed)
Labor Progress Note Rebecca Serrano is a 35 y.o. G3P2002 at [redacted]w[redacted]d who presented for cHTN.  S: Sitting in bed, tired but has no concerns.   O:  BP 133/73   Pulse 66   Temp 97.6 F (36.4 C) (Oral)   Resp 16   Ht 5\' 1"  (1.549 m)   Wt 104.1 kg   LMP 02/13/2020 (Approximate)   BMI 43.34 kg/m   CVE: Dilation: 5.5 Effacement (%): 80 Station: -2 Presentation: Vertex Exam by:: Dr [email or web address]   [name]: 35 y.o. 31 [redacted]w[redacted]d   #Labor: SVE unchanged from previous evaluations.  Pitocin was restarted and is now at 14. Replaced IUPC to ensure no issue with previous monitor.  #Pain: Epidural  #FWB: Cat 1 #GBS negative  [redacted]w[redacted]d, MD 6:20 PM

## 2020-11-07 NOTE — Discharge Summary (Signed)
Postpartum Discharge Summary  Date of Service updated     Patient Name: Rebecca Serrano DOB: [birth year] MRN: [id number]  Date of admission: 11/06/2020 Delivery date:11/07/2020  Delivering provider: Patriciaann Clan  Date of discharge: 11/09/2020  Admitting diagnosis: Chronic hypertension affecting pregnancy [O10.919] Intrauterine pregnancy: [redacted]w[redacted]d    Secondary diagnosis:  Active Problems:   Obesity, unspecified   History of C-section   Chronic hypertension affecting pregnancy  Additional problems: None     Discharge diagnosis: Term Pregnancy Delivered, VBAC, and CHTN                                              Post partum procedures: None Augmentation: AROM, Pitocin, and IP Foley Complications: None  Hospital course: Induction of Labor With Vaginal Delivery   35y.o. yo G3P3003 at 370w2das admitted to the hospital 11/06/2020 for induction of labor.  Indication for induction:  Chronic Hypertension .  Patient had an uncomplicated labor course as follows: Membrane Rupture Time/Date: 6:03 AM ,11/07/2020   Delivery Method:VBAC, Spontaneous  Episiotomy: None  Lacerations:  1st degree  Details of delivery can be found in separate delivery note.  Patient had a routine postpartum course. Patient is discharged home 11/09/20.  Newborn Data: Birth date:11/07/2020  Birth time:10:33 PM  Gender:Female  Living status:Living  Apgars:8 ,9  Weight:3391 g   Magnesium Sulfate received: No BMZ received: No Rhophylac:No MMR:No T-DaP:Given prenatally Flu: N/A Transfusion:No  Physical exam  Vitals:   11/08/20 1035 11/08/20 1330 11/08/20 2059 11/09/20 0510  BP: (!) 102/54 107/76 105/66 97/62  Pulse: 76 75 82 64  Resp: 20 18 14 18   Temp: (!) 97.3 F (36.3 C) (!) 96.9 F (36.1 C) 97.9 F (36.6 C) 97.6 F (36.4 C)  TempSrc: Oral Axillary Axillary Oral  SpO2: 97% 98%    Weight:      Height:       General: alert, cooperative, and no distress Lochia: appropriate Uterine Fundus:  firm Incision: N/A DVT Evaluation: No evidence of DVT seen on physical exam. No significant calf/ankle edema. Labs: Lab Results  Component Value Date   WBC 16.5 (H) 11/07/2020   HGB 13.3 11/07/2020   HCT 40.5 11/07/2020   MCV 91.6 11/07/2020   PLT 212 11/07/2020   CMP Latest Ref Rng & Units 11/06/2020  Glucose 70 - 99 mg/dL 82  BUN 6 - 20 mg/dL 9  Creatinine 0.44 - 1.00 mg/dL 0.66  Sodium 135 - 145 mmol/L 134(L)  Potassium 3.5 - 5.1 mmol/L 3.7  Chloride 98 - 111 mmol/L 104  CO2 22 - 32 mmol/L 20(L)  Calcium 8.9 - 10.3 mg/dL 9.7  Total Protein 6.5 - 8.1 g/dL 6.6  Total Bilirubin 0.3 - 1.2 mg/dL 0.2(L)  Alkaline Phos 38 - 126 U/L 159(H)  AST 15 - 41 U/L 30  ALT 0 - 44 U/L 18   Edinburgh Score: Edinburgh Postnatal Depression Scale Screening Tool 11/08/2020  I have been able to laugh and see the funny side of things. 0  I have looked forward with enjoyment to things. 0  I have blamed myself unnecessarily when things went wrong. 0  I have been anxious or worried for no good reason. 2  I have felt scared or panicky for no good reason. 0  Things have been getting on top of me. 0  I have  been so unhappy that I have had difficulty sleeping. 0  I have felt sad or miserable. 0  I have been so unhappy that I have been crying. 0  The thought of harming myself has occurred to me. 0  Edinburgh Postnatal Depression Scale Total 2     After visit meds:  Allergies as of 11/09/2020   No Known Allergies      Medication List     STOP taking these medications    aspirin EC 81 MG tablet   famotidine 20 MG tablet Commonly known as: PEPCID       TAKE these medications    acetaminophen 325 MG tablet Commonly known as: Tylenol Take 2 tablets (650 mg total) by mouth every 4 (four) hours as needed (for pain scale < 4).   ibuprofen 600 MG tablet Commonly known as: ADVIL Take 1 tablet (600 mg total) by mouth every 6 (six) hours as needed.   prenatal multivitamin Tabs  tablet Take 1 tablet by mouth daily at 12 noon.         Discharge home in stable condition Infant Feeding: Bottle and Breast Infant Disposition:home with mother Discharge instruction: per After Visit Summary and Postpartum booklet. Activity: Advance as tolerated. Pelvic rest for 6 weeks.  Diet: routine diet Future Appointments:No future appointments. Follow up Visit:  Message sent to [facility] by Dr Higinio Plan on 11/07/2020:   Please schedule this patient for a In person postpartum visit in 4 weeks with the following provider: Any provider. Additional Postpartum F/U:BP check 1 week  High risk pregnancy complicated by:  Chronic HTN Delivery mode:  VBAC, Spontaneous  Anticipated Birth Control:  OCPs vs POPs at postpartum visit    11/09/2020 Patriciaann Clan, DO

## 2020-11-07 NOTE — Progress Notes (Signed)
Labor Progress Note Rebecca Serrano is a 35 y.o. G3P2002 at [redacted]w[redacted]d presented for IOL due to [place].   S: Doing well at this time, resting comfortably.  O:  BP (!) 100/55   Pulse 66   Temp 97.7 F (36.5 C) (Oral)   Resp 16   Ht 5\' 1"  (1.549 m)   Wt 104.1 kg   LMP 02/13/2020 (Approximate)   BMI 43.34 kg/m  EFM: 135/mod/15x15/no decels   CVE: Dilation: 5.5 Effacement (%): 60, 70 Station: -2 Presentation: Vertex Exam by:: Dr [email or web address]   [name]: 35 y.o. 31 [redacted]w[redacted]d  #Labor: Currently on Pit.  AROM at last check.  Difficulty tracing contractions so IUPC placed at this check.  Pitocin currently at 20.  We will continue to monitor for adequacy. #Pain: Epidural  #FWB: Cat 1 #GBS negative   #cHTN: BP Wnl, actually on the lower side since arrival. Cont to monitor.   [redacted]w[redacted]d, MD 11:41 AM

## 2020-11-07 NOTE — Lactation Note (Signed)
This note was copied from a baby's chart. Lactation Consultation Note  Patient Name: Rebecca Serrano [id number] Date: 11/07/2020 Reason for consult: L&D Initial assessment;Term Age:36 hours P3, term female infant. LC entered the room, mom was doing skin to skin with infant and infant was cuing to breastfeed. Mom latched infant on her left breast using the football hold position, infant was on and off breast, but towards the end of the feeding infant started sustaining the latch.  Infant breastfeed with depth when latched for 12 minutes.  Afterwards mom was taught hand expression and infant was given 2 mls of colostrum by spoon. Mom knows to breastfeed infant according to cues, 8 to 12+ or more times within 24 hours, skin to skin. Mom knows to ask RN/LC on MBU if she has questions, concerns or needs latch assistance.  Mom would like to have hand pump for prn home use, mom doesn't have a breast pump and receives [facility] in [place] .  Maternal Data Has patient been taught Hand Expression?: Yes Does the patient have breastfeeding experience prior to this delivery?: Yes How long did the patient breastfeed?: Per mom, she briefly breastfed her 2nd child for only 2 weeks, difficulties with latching infant at the breast.  Feeding Mother's Current Feeding Choice: Breast Milk and Formula  LATCH Score Latch: Repeated attempts needed to sustain latch, nipple held in mouth throughout feeding, stimulation needed to elicit sucking reflex.  Audible Swallowing: A few with stimulation  Type of Nipple: Everted at rest and after stimulation  Comfort (Breast/Nipple): Soft / non-tender  Hold (Positioning): Assistance needed to correctly position infant at breast and maintain latch.  LATCH Score: 7   Lactation Tools Discussed/Used    Interventions Interventions: Breast feeding basics reviewed;Assisted with latch;Skin to skin;Hand express;Breast compression;Adjust position;Support pillows;Position  options;Expressed milk;Education  Discharge Pump:  (Mom would like hand pump on MBU for home use.) WIC Program: Yes  Consult Status Consult Status: Follow-up Date: 11/08/20 Follow-up type: In-patient    Danelle Earthly 11/07/2020, 11:53 PM

## 2020-11-07 NOTE — Progress Notes (Signed)
Labor Progress Note Rebecca Serrano is a 35 y.o. G3P2002 at [redacted]w[redacted]d who presented for cHTN.  S: Doing well. No concerns.  O:  BP 119/60   Pulse 67   Temp 97.6 F (36.4 C) (Oral)   Resp 16   Ht 5\' 1"  (1.549 m)   Wt 104.1 kg   LMP 02/13/2020 (Approximate)   BMI 43.34 kg/m   CVE: Dilation: 5.5 Effacement (%): 80 Station: -2 Presentation: Vertex Exam by:: Dr [email or web address]   [name] 31 [redacted]w[redacted]d   #Labor: SVE largely unchanged. Pitocin at 24 milli-units/min. Will trial Pitocin break for 45 min-1 hour and then restart. Consider replacing IUPC on next check.  #Pain: Epidural  #FWB: Cat 1 #GBS negative  [redacted]w[redacted]d, MD 1:31 PM

## 2020-11-07 NOTE — Progress Notes (Signed)
Labor Progress Note Rebecca Serrano is a 35 y.o. G3P2002 at [redacted]w[redacted]d presented for IOL due to [place].   S: Doing well. Having some cramping on the left.   O:  BP (!) 110/59   Pulse 67   Temp 98.2 F (36.8 C) (Oral)   Resp 17   Ht 5\' 1"  (1.549 m)   Wt 104.1 kg   LMP 02/13/2020 (Approximate)   BMI 43.34 kg/m  EFM: 135/minimal to mod/none/none  CVE: Dilation: 9 Effacement (%): 90 Station: 0 Presentation: Vertex Exam by:: [email or web address], MD   [name]: 35 y.o. 31 100w2d  #Labor: Fortunately with cervical change, now about 9cm. Anticipate vaginal delivery shortly.  #Pain: Epidural in place  #FWB: Cat 1-II with some minimal variability, starting to return to moderate #GBS negative  #cHTN: No severe range, most BP in normal range. Pre-e labs on admit WNL. Cont to monitor.   [redacted]w[redacted]d, DO 8:57 PM

## 2020-11-07 NOTE — Lactation Note (Signed)
This note was copied from a baby's chart. Lactation Consultation Note  Patient Name: Rebecca Serrano [id number] Date: 11/07/2020   Age:36 hours In phone discussion with L&D (RN), mom would like LC services, RN will call LC back on Vocera once mom is finished with repair.  Maternal Data    Feeding    LATCH Score                    Lactation Tools Discussed/Used    Interventions    Discharge    Consult Status      Danelle Earthly 11/07/2020, 10:53 PM

## 2020-11-07 NOTE — Progress Notes (Signed)
Labor Progress Note Rebecca Serrano is a 35 y.o. G3P2002 at [redacted]w[redacted]d presented for IOL due to [place].   S: Doing well. FB came out around 2250 and 3cm per RN at that time. Still feeling contractions but not ready for epidural quite yet.   O:  BP 106/65   Pulse 72   Temp 97.7 F (36.5 C) (Oral)   Resp 16   Ht 5\' 1"  (1.549 m)   Wt 104.1 kg   LMP 02/13/2020 (Approximate)   BMI 43.34 kg/m  EFM: 140/mod/15x15/no decels   CVE: Dilation: 4 Effacement (%): 50 Station: -3 Presentation: Vertex Exam by:: [email or web address], DO   [name]: 35 y.o. 31 [redacted]w[redacted]d  #Labor: Some progression since last evaluated, still relatively thick and quite posterior. Unfavorable exam for AROM, consider on next check. Cont titrating pit 2x2, currently at 14.  #Pain: Planning for epidural  #FWB: Cat 1  #GBS negative  #cHTN: BP wnl since admit. Pre-e labs wnl. Cont to monitor.   #TOLAC: mindful with IOL.    [redacted]w[redacted]d, DO 2:55 AM

## 2020-11-08 DIAGNOSIS — Z3A39 39 weeks gestation of pregnancy: Secondary | ICD-10-CM

## 2020-11-08 DIAGNOSIS — O1002 Pre-existing essential hypertension complicating childbirth: Secondary | ICD-10-CM

## 2020-11-08 DIAGNOSIS — O34211 Maternal care for low transverse scar from previous cesarean delivery: Secondary | ICD-10-CM

## 2020-11-08 MED ORDER — SODIUM CHLORIDE 0.9% FLUSH
3.0000 mL | Freq: Two times a day (BID) | INTRAVENOUS | Status: DC
  Administered 2020-11-08: 3 mL via INTRAVENOUS

## 2020-11-08 MED ORDER — DIBUCAINE (PERIANAL) 1 % EX OINT: 1.0000 "application " | TOPICAL_OINTMENT | CUTANEOUS | Status: DC | PRN

## 2020-11-08 MED ORDER — WITCH HAZEL-GLYCERIN EX PADS: 1.0000 "application " | MEDICATED_PAD | CUTANEOUS | Status: DC | PRN

## 2020-11-08 MED ORDER — ACETAMINOPHEN 325 MG PO TABS
650.0000 mg | ORAL_TABLET | ORAL | Status: DC | PRN
  Administered 2020-11-08 – 2020-11-09 (×3): 650 mg via ORAL
  Filled 2020-11-08 (×3): qty 2

## 2020-11-08 MED ORDER — SODIUM CHLORIDE 0.9% FLUSH: 3.0000 mL | INTRAVENOUS | Status: DC | PRN

## 2020-11-08 MED ORDER — SENNOSIDES-DOCUSATE SODIUM 8.6-50 MG PO TABS
2.0000 | ORAL_TABLET | ORAL | Status: DC
  Administered 2020-11-08 – 2020-11-09 (×2): 2 via ORAL
  Filled 2020-11-08 (×2): qty 2

## 2020-11-08 MED ORDER — SODIUM CHLORIDE 0.9 % IV SOLN: 250.0000 mL | INTRAVENOUS | Status: DC | PRN

## 2020-11-08 MED ORDER — PRENATAL MULTIVITAMIN CH
1.0000 | ORAL_TABLET | Freq: Every day | ORAL | Status: DC
  Administered 2020-11-08 – 2020-11-09 (×2): 1 via ORAL
  Filled 2020-11-08 (×2): qty 1

## 2020-11-08 MED ORDER — ZOLPIDEM TARTRATE 5 MG PO TABS: 5.0000 mg | ORAL_TABLET | Freq: Every evening | ORAL | Status: DC | PRN

## 2020-11-08 MED ORDER — COCONUT OIL OIL: 1.0000 "application " | TOPICAL_OIL | Status: DC | PRN

## 2020-11-08 MED ORDER — MEASLES, MUMPS & RUBELLA VAC IJ SOLR: 0.5000 mL | Freq: Once | INTRAMUSCULAR | Status: DC

## 2020-11-08 MED ORDER — DIPHENHYDRAMINE HCL 25 MG PO CAPS: 25.0000 mg | ORAL_CAPSULE | Freq: Four times a day (QID) | ORAL | Status: DC | PRN

## 2020-11-08 MED ORDER — SIMETHICONE 80 MG PO CHEW: 80.0000 mg | CHEWABLE_TABLET | ORAL | Status: DC | PRN

## 2020-11-08 MED ORDER — BENZOCAINE-MENTHOL 20-0.5 % EX AERO
1.0000 "application " | INHALATION_SPRAY | CUTANEOUS | Status: DC | PRN
  Administered 2020-11-08: 1 via TOPICAL
  Filled 2020-11-08: qty 56

## 2020-11-08 MED ORDER — IBUPROFEN 600 MG PO TABS
600.0000 mg | ORAL_TABLET | Freq: Four times a day (QID) | ORAL | Status: DC
  Administered 2020-11-08 – 2020-11-09 (×6): 600 mg via ORAL
  Filled 2020-11-08 (×6): qty 1

## 2020-11-08 MED ORDER — TETANUS-DIPHTH-ACELL PERTUSSIS 5-2.5-18.5 LF-MCG/0.5 IM SUSY: 0.5000 mL | PREFILLED_SYRINGE | Freq: Once | INTRAMUSCULAR | Status: DC

## 2020-11-08 MED ORDER — ONDANSETRON HCL 4 MG PO TABS: 4.0000 mg | ORAL_TABLET | ORAL | Status: DC | PRN

## 2020-11-08 MED ORDER — ONDANSETRON HCL 4 MG/2ML IJ SOLN: 4.0000 mg | INTRAMUSCULAR | Status: DC | PRN

## 2020-11-08 NOTE — Lactation Note (Signed)
This note was copied from a baby's chart. Lactation Consultation Note  Patient Name: Rebecca Serrano [id number] Date: 11/08/2020   Age:36 hours   Per RN: Mother does not desire lactation services.   Maternal Data    Feeding    LATCH Score                    Lactation Tools Discussed/Used    Interventions    Discharge    Consult Status      [name] 11/08/2020, 2:43 PM

## 2020-11-08 NOTE — Progress Notes (Signed)
Patient seen on Mother Baby due to CRNA report of right thigh sensory changes following delivery. Patient had epidural placed without noted complication on 9/30 at 05:23. She delivered last night at 22:33 and epidural was subsequently discontinued. She attempted to get out of bed at approximately 03:00 today without assistance and fell to the ground on her right side. She has had mild-moderate pain to her right lumbar paraspinal area since that time. She has also noted her right leg seems to be more weak than left. She has been out of bed and ambulating today with assistance for balance. She denies any midline back pain other than very mild tenderness at epidural insertion site. She has no progressive sensory or motor deficits. She has an area over her right anterior and lateral thigh that she states has decreased sensation but is not painful.  On exam, she is awake and alert, sitting in bed, in NAD. Epidural site with appropriate mild tenderness, no skin changes. Right lumbar paraspinal muscles somewhat tender to palpation. Strength in lower extremities 5/5 in all muscle groups. Gait not observed. No objective changes in sensation to lower extremities.   Her right anterior and lateral thigh sensory changes are consistent with meralgia paresthetica, exacerbated by pregnancy, obesity, and recent lithotomy positioning. I have counseled her that sensory changes following labor and delivery are common, most likely unrelated to epidural, and will resolve within days to weeks. Her unsteadiness and subjective weakness to her entire right leg resulting in fall this morning were likely related to residual effects of her epidural. She has been up walking since this incident and her strength seems to be improving. I have counseled her to only get out of bed with assistance at this time. I suspect her motor function will improve today and tomorrow. She was instructed to call back for progressive weakness or any other  concerns.    Rollene Rotunda, MD, MPH Anesthesiology

## 2020-11-08 NOTE — Progress Notes (Signed)
POSTPARTUM PROGRESS NOTE  Post Partum Day 1  Subjective:  Rebecca Serrano is a 35 y.o. V6P0141 s/p VD at [redacted]w[redacted]d.  She reports she is doing well. She did have a fall yesterday evening, felt that her epidural wasn't quite off enough yet. Caught herself but also landed on her mid right back. That area is sore but manageable currently. Not feeling weak or numb, but hasn't tried getting up again yet. She denies any problems with voiding or po intake. Denies nausea or vomiting.  Pain is well controlled.  Lochia is mild.  Objective: Blood pressure 94/67, pulse 67, temperature (!) 97.5 F (36.4 C), temperature source Oral, resp. rate 18, height 5\' 1"  (1.549 m), weight 104.1 kg, last menstrual period 02/13/2020, SpO2 98 %, unknown if currently breastfeeding.  Physical Exam:  General: alert, cooperative and no distress Chest: no respiratory distress Heart:regular rate, distal pulses intact Uterine Fundus: firm, appropriately tender DVT Evaluation: No calf swelling or tenderness Extremities: minimal edema Skin: warm, dry MSK: mild diffuse tenderness to right para-thoracic musculature around ribs 5-8 without localized tenderness. No ecchymosis or swelling seen. Able to spontaneously move her upper extremities.    Recent Labs    11/07/20 0427 11/07/20 2301  HGB 12.5 13.3  HCT 37.4 40.5    Assessment/Plan: Rebecca Serrano is a 35 y.o. 31 s/p VD at [redacted]w[redacted]d   PPD#1 - Doing well  Routine postpartum care  Mechanical fall: In the setting of incomplete epidural resolution. Landed on her right with some non-specific tenderness on that side. Will cont pain medication and heating pad. Low suspicion for fracture, if significant difficulty with more moving, consider Xrays later today.   Chronic HTN: BP WNL. Cont to monitor.   Contraception: Considering OCPs Feeding: Both Dispo: Plan for discharge tomorrow.   LOS: 2 days   [redacted]w[redacted]d, DO  OB Fellow  11/08/2020, 8:14 AM

## 2020-11-08 NOTE — Anesthesia Postprocedure Evaluation (Signed)
Anesthesia Post Note  Patient: Rebecca Serrano  Procedure(s) Performed: AN AD HOC LABOR EPIDURAL     Patient location during evaluation: Mother Baby Anesthesia Type: Epidural Level of consciousness: awake and alert Pain management: pain level controlled Vital Signs Assessment: post-procedure vital signs reviewed and stable Respiratory status: spontaneous breathing, nonlabored ventilation and respiratory function stable Cardiovascular status: stable Postop Assessment: no headache, no backache and epidural receding Anesthetic complications: no   No notable events documented.  Last Vitals:  Vitals:   11/08/20 0537 11/08/20 0758  BP: (!) 94/58 94/67  Pulse: 65 67  Resp: 16 18  Temp: (!) 36.4 C (!) 36.4 C  SpO2: 97% 98%    Last Pain:  Vitals:   11/08/20 0758  TempSrc: Oral  PainSc: 5    Pain Goal: Patients Stated Pain Goal: 5 (11/06/20 1931)                 [name]

## 2020-11-08 NOTE — Progress Notes (Signed)
   11/08/20 0315 11/08/20 0400  What Happened  Was fall witnessed? No  --   Was patient injured? Yes (Pt reports new onset middle back pain post fall)  --   Patient found in bathroom (pt crawled from floor to toilet, found seated on toilet)  --   Found by No one-pt stated they fell  --   Stated prior activity bathroom-unassisted  --   Follow Up  MD notified  --  Philipp Deputy, CNM  Time MD notified  --  0400  Family notified  --  Yes - comment (In room)  Time family notified  --   (Family witnessed fall; at bedside)  Additional tests  --  No  Progress note created (see row info)  --  Yes  Adult Fall Risk Assessment  Patient Fall Risk Level High fall risk  --   Vitals  Temp 98.2 F (36.8 C)  --   Temp Source Oral  --   BP 111/73  --   BP Location Right Arm  --   BP Method Automatic  --   Patient Position (if appropriate) Sitting  --   Pulse Rate 80  --   Pulse Rate Source Dinamap  --   Resp 18  --   Oxygen Therapy  SpO2 99 %  --   Pain Assessment  Pain Scale 0-10  --   Pain Score 6  --   Pain Type Acute pain  --   Pain Location Back  --   Pain Orientation Mid  --   Pain Descriptors / Indicators Discomfort;Sore  --   Pain Onset Sudden  --   Pain Intervention(s) Medication (See eMAR)  --   Multiple Pain Sites No  --   Neurological  Neuro (WDL) WDL  --   Musculoskeletal  Musculoskeletal (WDL) WDL  --   Integumentary  Integumentary (WDL) WDL  --   Pain Assessment  Date Pain First Started 11/08/20  --   Result of Injury Yes  --   Pain Assessment  Work-Related Injury No  --

## 2020-11-08 NOTE — Progress Notes (Signed)
Pt family member called [employer] school teacher requesting help. Nurse secretary called RN on Rebecca Serrano. RN was in another room but went to this pt's room as soon as she was done. RN found pt in the bathroom on the toilet. Pt reported that she had fallen and crawled to the bathroom because she had to void. Vital signs were obtained and head to toe assessment was completed. Vital signs were stable. Pt reported new onset middle back pain. Charge RN and Philipp Deputy CNM notified. Fall huddle with pt and family member to be completed. Pt to be reassessed 2 hours post fall per protocol. No other orders given at this time.

## 2020-11-09 MED ORDER — ACETAMINOPHEN 325 MG PO TABS: 650.0000 mg | ORAL_TABLET | ORAL | Status: DC | PRN

## 2020-11-09 MED ORDER — IBUPROFEN 600 MG PO TABS: 600.0000 mg | ORAL_TABLET | Freq: Four times a day (QID) | ORAL | 0 refills | Status: DC | PRN

## 2020-11-09 NOTE — Lactation Note (Signed)
This note was copied from a baby's chart. Lactation Consultation Note  Patient Name: Rebecca Serrano [id number] Date: 11/09/2020 Reason for consult: Follow-up assessment;Term Age:36 hours   LC Follow Up Consult:  Mother originally declined lactation services, however, she was interested in meeting with me prior to discharge.  Mother is curious as to why it takes her daughter "a long time" to latch.  Discussed some basic latching techniques since baby was asleep on father's chest.  Offered to return at the next feeding to further assess.  Mother is interested and will have RN call for my assistance when baby is ready to feed.  RN in room at the end of my visit and updated.   Maternal Data    Feeding Mother's Current Feeding Choice: Breast Milk and Formula Nipple Type: Slow - flow  LATCH Score                    Lactation Tools Discussed/Used    Interventions    Discharge    Consult Status Consult Status: Follow-up Date: 11/09/20 Follow-up type: In-patient    [name] R Cathlene Gardella 11/09/2020, 10:30 AM

## 2020-11-09 NOTE — Lactation Note (Signed)
This note was copied from a baby's chart. Lactation Consultation Note  Patient Name: Rebecca Serrano Date: 11/09/2020 Reason for consult: Follow-up assessment;Term Age:36 hours   LC Follow Up Note:  Mother called for latch observation as requested.  Mother had baby latched in the cradle hold when I arrived.  Made a couple of suggestions for better breast support, body alignment and stimulation for baby.  Mother receptive and baby continued to feed with a wide gape and flanged lips after revision.  Mother denied pain with latching.    Reviewed plan of care for after discharge.  Mother feels comfortable and is ready for discharge.  RN notified.   Maternal Data    Feeding Mother's Current Feeding Choice: Breast Milk and Formula Nipple Type: Slow - flow  LATCH Score Latch: Grasps breast easily, tongue down, lips flanged, rhythmical sucking.  Audible Swallowing: A few with stimulation  Type of Nipple: Everted at rest and after stimulation  Comfort (Breast/Nipple): Soft / non-tender  Hold (Positioning): No assistance needed to correctly position infant at breast.  LATCH Score: 9   Lactation Tools Discussed/Used    Interventions Interventions: Breast feeding basics reviewed;Education  Discharge    Consult Status Consult Status: Complete Date: 11/09/20 Follow-up type: Call as needed    Oneta Sigman R Harish Bram 11/09/2020, 12:07 PM

## 2020-11-09 NOTE — Discharge Instructions (Signed)
Congratulations!   -Take tylenol 1000 mg every 6 hours as needed for pain, alternate with ibuprofen 600 mg every 6 hours -Drink plenty of water to help with breastfeeding -Continue prenatal vitamins while you are breastfeeding -Think about birth control options-->bedisider.org is a great website! You can get any form of birth control from the health department for free - Make sure if you haven't scheduled your postpartum visit, to schedule to be seen around 4-6 weeks or sooner if needed   

## 2020-11-10 ENCOUNTER — Ambulatory Visit: Payer: Self-pay

## 2020-11-10 NOTE — Lactation Note (Signed)
This note was copied from a baby's chart. Lactation Consultation Note  Patient Name: Rebecca Serrano [id number] Date: 11/10/2020 Reason for consult: Follow-up assessment;Term Age:36 hours  Mom says she is offering breast & formula. I provided her a hand pump and sized her for a size 24 flange. Expressed milk was seen immediately with flange sizing. Mom was pleased to see her milk--she shared that she had been thinking about quitting breast feeding.  I encouraged Mom to pump each breast for 10-15 minutes every time infant receives formula. Mom verbalized understanding. Mom knows how to reach [place] for post-discharge questions.   Lurline Hare [facility] 11/10/2020, 1:49 PM

## 2020-11-14 ENCOUNTER — Other Ambulatory Visit: Payer: Self-pay

## 2020-11-14 ENCOUNTER — Ambulatory Visit (INDEPENDENT_AMBULATORY_CARE_PROVIDER_SITE_OTHER): Payer: Self-pay

## 2020-11-14 VITALS — BP 110/67 | HR 84 | Wt 211.8 lb

## 2020-11-14 DIAGNOSIS — Z0130 Encounter for examination of blood pressure without abnormal findings: Secondary | ICD-10-CM

## 2020-11-14 NOTE — Patient Instructions (Signed)
Constipation:  Colace  Ducolax suppositories  Fleet enema  Glycerin suppositories  Metamucil  Milk of magnesia  Miralax  Senokot  Smooth move tea          

## 2020-11-14 NOTE — Progress Notes (Signed)
Agree with nurses's documentation of this patient's clinic encounter.  Michaelah Credeur L, MD  

## 2020-11-14 NOTE — Progress Notes (Signed)
Blood Pressure Check Visit  Rebecca Serrano is here for blood pressure check following spontaneous vaginal delivery on 11/07/20. History of chronic hypertension, no medication for this during pregnancy. BP today is 110/67. Patient denies any headache or visual changes. Trace edema to bilateral lower legs. Reviewed with Alysia Penna, MD who recommends pt follow up as previously scheduled.  Patient reports some feelings of constipation with daily BM. Reviewed options for stool softener. Pt reports continued pain in vaginal area and right hip, lower back, which was place of impact during fall in the hospital. Encouraged pt to continue ibuprofen and to add Tylenol if needed. Also encouraged use of heating pad or warm shower.  Marjo Bicker, RN 11/14/2020  8:44 AM

## 2020-11-18 ENCOUNTER — Telehealth (HOSPITAL_COMMUNITY): Payer: Self-pay | Admitting: *Deleted

## 2020-11-18 NOTE — Telephone Encounter (Signed)
Patient voiced no questions or concerns regarding her own health. EPDS = 4. Patient voiced no questions or concerns regarding baby at this time. Patient reported infant sleeps in a bassinet on her back. RN reviewed ABCs of safe sleep - patient verbalized understanding. Patient requested RN email information on hospital's breastfeeding and pumping support groups. Email sent. Deforest Hoyles, RN, 11/18/20, 2006

## 2020-12-15 ENCOUNTER — Other Ambulatory Visit: Payer: Self-pay

## 2020-12-15 ENCOUNTER — Encounter: Payer: Self-pay | Admitting: Nurse Practitioner

## 2020-12-15 ENCOUNTER — Ambulatory Visit (INDEPENDENT_AMBULATORY_CARE_PROVIDER_SITE_OTHER): Payer: Self-pay | Admitting: Nurse Practitioner

## 2020-12-15 DIAGNOSIS — Z8659 Personal history of other mental and behavioral disorders: Secondary | ICD-10-CM

## 2020-12-15 DIAGNOSIS — N393 Stress incontinence (female) (male): Secondary | ICD-10-CM

## 2020-12-15 DIAGNOSIS — Z23 Encounter for immunization: Secondary | ICD-10-CM

## 2020-12-15 DIAGNOSIS — Z6839 Body mass index (BMI) 39.0-39.9, adult: Secondary | ICD-10-CM

## 2020-12-15 DIAGNOSIS — Z30011 Encounter for initial prescription of contraceptive pills: Secondary | ICD-10-CM

## 2020-12-15 DIAGNOSIS — Z8742 Personal history of other diseases of the female genital tract: Secondary | ICD-10-CM | POA: Insufficient documentation

## 2020-12-15 MED ORDER — NORGESTIMATE-ETH ESTRADIOL 0.25-35 MG-MCG PO TABS: 1.0000 | ORAL_TABLET | Freq: Every day | ORAL | 11 refills | Status: DC

## 2020-12-15 NOTE — Progress Notes (Signed)
Post Partum Visit Note  Rebecca Serrano is a 35 y.o. G51P3003 female who presents for a postpartum visit. She is 5 weeks 3 days postpartum following a normal spontaneous vaginal delivery.  I have fully reviewed the prenatal and intrapartum course. The delivery was at 39 weeks 2 days.  Anesthesia: epidural. Postpartum course has been good. Baby is doing well. Baby is feeding by bottle - Gerber Gentle . Bleeding no bleeding. Bowel function is normal. Bladder function is normal. Patient is sexually active. Contraception method is OCP (estrogen/progesterone). Postpartum depression screening: negative.     The pregnancy intention screening data noted above was reviewed. Potential methods of contraception were discussed. The patient elected to proceed with Pills.   Edinburgh Postnatal Depression Scale - 12/15/20 1103       Edinburgh Postnatal Depression Scale:  In the Past 7 Days   I have been able to laugh and see the funny side of things. 0    I have looked forward with enjoyment to things. 0    I have blamed myself unnecessarily when things went wrong. 0    I have been anxious or worried for no good reason. 3    I have felt scared or panicky for no good reason. 2    Things have been getting on top of me. 1    I have been so unhappy that I have had difficulty sleeping. 0    I have felt sad or miserable. 0    I have been so unhappy that I have been crying. 0    The thought of harming myself has occurred to me. 0    Edinburgh Postnatal Depression Scale Total 6             Health Maintenance Due  Topic Date Due   INFLUENZA VACCINE  09/08/2020   COVID-19 Vaccine (4 - Booster for Pfizer series) 10/15/2020    The following portions of the patient's history were reviewed and updated as appropriate: allergies, current medications, past family history, past medical history, past social history, past surgical history, and problem list.  Review of Systems Pertinent items noted in HPI and  remainder of comprehensive ROS otherwise negative.  Objective:  BP 105/64   Pulse 72   Wt 211 lb 6.4 oz (95.9 kg)   Breastfeeding No   BMI 39.94 kg/m    General:  alert, cooperative, and no distress   Breasts:  Not examined  Lungs: clear to auscultation bilaterally  Heart:  regular rate and rhythm, S1, S2 normal, no murmur, click, rub or gallop  Abdomen: Not examined    Wound NA  GU exam:  not indicated      Back pain - upper back at base of neck and across shoulders Assessment:   Normal postpartum exam.  Began contraceptive pills. Normal BP at today's visit  Plan:   Essential components of care per ACOG recommendations:  1.  Mood and well being: Patient with negative depression screening today. Reviewed local resources for support.  She reports some anxiety but delines referral to [facility] at this time.  Will reevaluated in a couple of weeks and if not better, will call for an appointment. - Patient tobacco use? No.   - hx of drug use? No.    2. Infant care and feeding:  -Patient currently breastmilk feeding? No.  -Social determinants of health (SDOH) reviewed in EPIC. No concerns    3. Sexuality, contraception and birth spacing - Patient does not  want a pregnancy in the next year.  Desired family size is unknown number of children.  - Reviewed forms of contraception in tiered fashion. Patient desired oral contraceptives (estrogen/progesterone) today.   - Discussed birth spacing of 18 months  4. Sleep and fatigue -Encouraged family/partner/community support of 4 hrs of uninterrupted sleep to help with mood and fatigue  5. Physical Recovery  - Discussed patients delivery and complications. She describes her labor as good. - Patient had a Vaginal, no problems at delivery. Patient had  no  laceration. Perineal healing reviewed. Patient expressed understanding - Patient has urinary incontinence? Yes. Offered PT and patient accepted. Patient was referred to pelvic floor PT.  -  Patient is safe to resume physical and sexual activity.  Had first intercourse last night - used condom.  Advised condoms until finished first pack of pills.  To start pills this week.  6.  Health Maintenance - HM due items addressed Yes - Last pap smear  Diagnosis  Date Value Ref Range Status  04/18/2020   Final   - Negative for intraepithelial lesion or malignancy (NILM)   Pap smear not done at today's visit.  -Breast Cancer screening indicated? No.   7. Chronic Disease/Pregnancy Condition follow up: None Patient will decide if she needs follow up for anxiety in next 2 weeks.  Advised to call the office as a [facility] provider in our office may be helpful for her. Advised massage with tennis ball for upper back pain - also using methol creams on her upper back.  Referred for pelvic PT to help with stress incontinence. Had flu vaccine today.  Currie Paris, NP [facility] [employer], [facility]

## 2021-01-13 ENCOUNTER — Other Ambulatory Visit: Payer: Self-pay

## 2021-01-13 ENCOUNTER — Encounter: Payer: Self-pay | Admitting: Family Medicine

## 2021-01-13 ENCOUNTER — Ambulatory Visit (INDEPENDENT_AMBULATORY_CARE_PROVIDER_SITE_OTHER): Payer: Self-pay | Admitting: Family Medicine

## 2021-01-13 VITALS — BP 122/64 | HR 82 | Ht 61.0 in

## 2021-01-13 DIAGNOSIS — M25512 Pain in left shoulder: Secondary | ICD-10-CM

## 2021-01-13 DIAGNOSIS — R079 Chest pain, unspecified: Secondary | ICD-10-CM

## 2021-01-13 NOTE — Progress Notes (Signed)
    SUBJECTIVE:   CHIEF COMPLAINT / HPI:   Left chest and arm sore for about a week: No shortness of breath.She can't find a set place that hurts with palpation of the left chest but massaging on her left chest improves it. She thinks it may be due to carrying her 32mo old.  She states that sometimes when it is cold her left shoulder will ache a bit.  Pain is not radiating and is not aggravated by going up steps or activity.  She can stretch her left chest muscle and it seems to improve her discomfort.  PERTINENT  PMH / PSH: None  OBJECTIVE:   BP 122/64   Pulse 82   Ht 5\' 1"  (1.549 m)   SpO2 99%   BMI 39.94 kg/m    General: NAD, pleasant, able to participate in exam Cardiac: RRR, no murmurs. Respiratory: CTAB, normal effort, No wheezes, rales or rhonchi MSK: No pain with palpation of the left anterior chest wall but her symptoms do feel better when stretching the left pectoralis muscle.  She has some minor discomfort on palpation of the biceps tendon but no pain with palpation of the remainder of the left shoulder.  She has negative empty can test, negative drop arm test, normal range of motion active and passive with the left shoulder. Neuro: alert, no obvious focal deficits Psych: Normal affect and mood  ASSESSMENT/PLAN:   Left chest and shoulder pain: No shortness of breath or associated symptoms.  Left chest seems to be musculoskeletal as it improves with massage and with stretching.  She has been carrying a 51-month-old lately which may be the cause of her symptoms.  Her left shoulder seems to hurt most in the region of the biceps tendon.  Remainder of her shoulder exam is benign.  She denies any trauma as the cause of her symptoms.  Cardiac and lung auscultatory exam is normal.  Low concern for cardiac or pulmonary etiology as cause of her chest pain.  Recommended using Voltaren gel as well as heat/ice and regular stretching.  Explained that if her symptoms continue for another week  or more we can consider getting her in with physical therapy.  She may have a mild bout of tendinitis of her left biceps tendon from continuingly carrying her 48-month-old.  Recommended that she use ibuprofen 400 mg every 6 hours for the next 3 days to see if improves her symptoms.   3-month, DO [facility]

## 2021-01-13 NOTE — Patient Instructions (Signed)
For your left-sided chest pain and left shoulder pain I recommend that you take ibuprofen 400 mg every 6 hours for the next 3 days to see if it improves your symptoms.  If you develop any shortness of breath or other concerning symptoms with your chest pain please let me know.  I expect this is musculoskeletal based off her physical exam.  I do recommend that she use ice and heat on it and continue doing the stretches that I showed you.  If it does not improve over the next week I would like to see you back and we can discuss physical therapy.

## 2021-07-14 ENCOUNTER — Encounter: Payer: Self-pay | Admitting: *Deleted

## 2021-12-02 ENCOUNTER — Other Ambulatory Visit: Payer: Self-pay | Admitting: Nurse Practitioner

## 2021-12-08 ENCOUNTER — Other Ambulatory Visit: Payer: Self-pay | Admitting: General Practice

## 2021-12-18 NOTE — Progress Notes (Signed)
    SUBJECTIVE:   CHIEF COMPLAINT / HPI:   Rebecca Serrano is a 36 y.o. female who presents to the [facility] clinic today to discuss the following concerns:   OCP  Has been taking Estarylla for the last year, has been doing well with it. LMP around October 15th. Pap is UTD. She is not interested in any other forms of contraception at this time.  No history of breast cancer, blood clots, migraines.  She thinks that previously she had a history of hypertension and was on medications but has been off medications and blood pressures have been well controlled for the last several years.  Non-smoker.  Chest Discomfort, Anxiety  Patient brought up that occasionally she will feel chest discomfort across her chest.  Feels sharp, lasts about an hour before it self subsides.  Her last episode was about 2 weeks ago.  She has been undergoing increased stress since delivery of her newborn.  She currently has a therapist/counselor whom she works with.  She does feel that she experience panic attacks. She tries to work on her breathing during these episodes and that seems to help.   PERTINENT  PMH / PSH: None  OBJECTIVE:   BP 114/78   Pulse 80   Ht 5\' 1"  (1.549 m)   Wt 201 lb (91.2 kg)   LMP 11/22/2021 (Approximate)   Breastfeeding No   BMI 37.98 kg/m    General: NAD, pleasant, able to participate in exam Cardiac: RRR, no murmurs. Respiratory: CTAB, normal effort, No wheezes, rales or rhonchi Psych: Normal affect and mood  ASSESSMENT/PLAN:    1. Encounter for surveillance of other contraceptive Refilled for a year. Normotensive and without other risk factors.  - POCT urine pregnancy: Negative - Patient to return if she would like to discuss other forms of contraception in the future  2. Anxiety Periods of intermittent chest pain during periods of stress. She also reports possible panic attacks. She is under stress. Normal cardiopulmonary exam. No recent episodes and asymptomatic today so defer EKG-  doubt cardiac cause. She has a therapist/counselor that she sees and I recommended that she discuss with them for breathing techniques. Discussed possibility of adding medications if this progresses or does not improve.    11/24/2021, DO [facility]

## 2021-12-21 ENCOUNTER — Ambulatory Visit (INDEPENDENT_AMBULATORY_CARE_PROVIDER_SITE_OTHER): Payer: Self-pay | Admitting: Family Medicine

## 2021-12-21 ENCOUNTER — Encounter: Payer: Self-pay | Admitting: Family Medicine

## 2021-12-21 VITALS — BP 114/78 | HR 80 | Ht 61.0 in | Wt 201.0 lb

## 2021-12-21 DIAGNOSIS — F419 Anxiety disorder, unspecified: Secondary | ICD-10-CM

## 2021-12-21 DIAGNOSIS — Z3049 Encounter for surveillance of other contraceptives: Secondary | ICD-10-CM

## 2021-12-21 LAB — POCT URINE PREGNANCY: Preg Test, Ur: NEGATIVE

## 2021-12-21 MED ORDER — NORGESTIMATE-ETH ESTRADIOL 0.25-35 MG-MCG PO TABS: 1.0000 | ORAL_TABLET | Freq: Every day | ORAL | 11 refills | Status: DC

## 2021-12-21 NOTE — Patient Instructions (Signed)
It was wonderful to see you today.  Today we talked about:  -I have refilled your birth control for 1 year. If you ever feel like this method isn't working for you anymore, please come back. I am always happy to discuss other methods.  -Remember this works best if taken daily at the same time every day.  -Remember this doesn't protect against sexually transmitted infections.   Thank you for coming to your visit as scheduled. We have had a large "no-show" problem lately, and this significantly limits our ability to see and care for patients. As a friendly reminder- if you cannot make your appointment please call to cancel. We do have a no show policy for those who do not cancel within 24 hours. Our policy is that if you miss or fail to cancel an appointment within 24 hours, 3 times in a 77-month period, you may be dismissed from our clinic.   Thank you for choosing [facility].   Please call [phone] with any questions about today's appointment.  Please be sure to schedule follow up at the front  desk before you leave today.   Sabino Dick, DO PGY-3 Family Medicine

## 2022-07-04 IMAGING — US US MFM OB FOLLOW-UP
1 series · 14 of 28 positions shown · non-contrast
Comparison: none

[Series 1: us mfm ob follow-up · 14 of 36 slices shown]
[im 2/36]
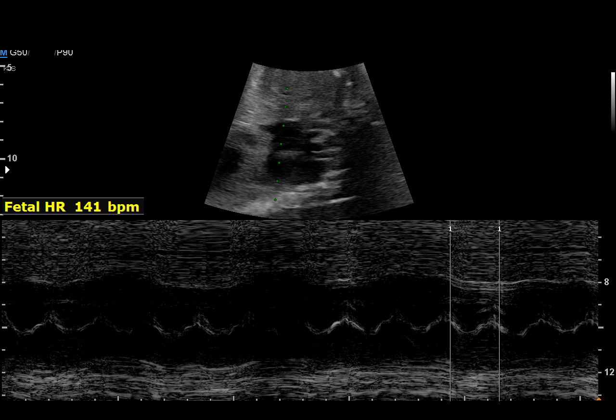
[im 4/36]
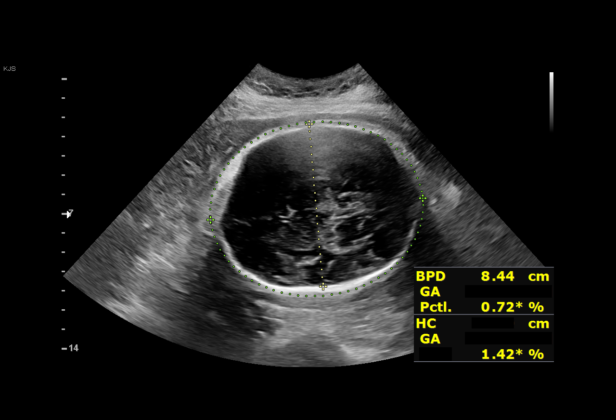
[im 7/36]
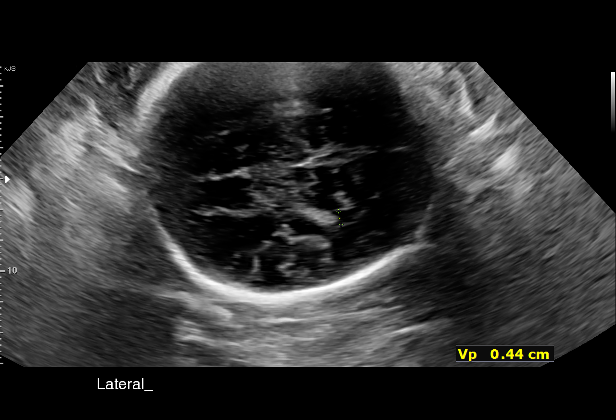
[im 10/36]
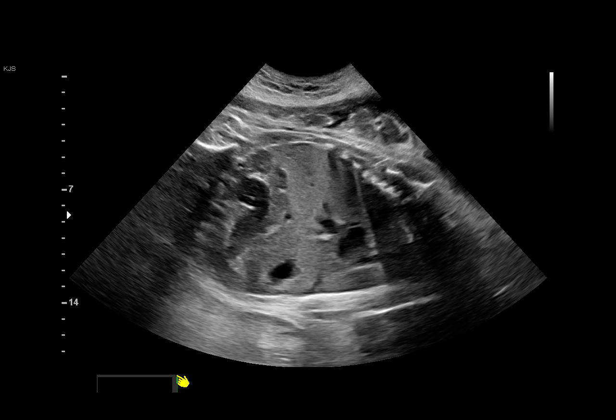
[im 12/36]
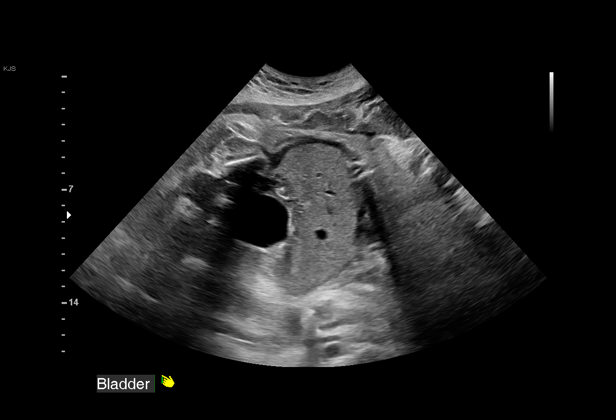
[im 15/36]
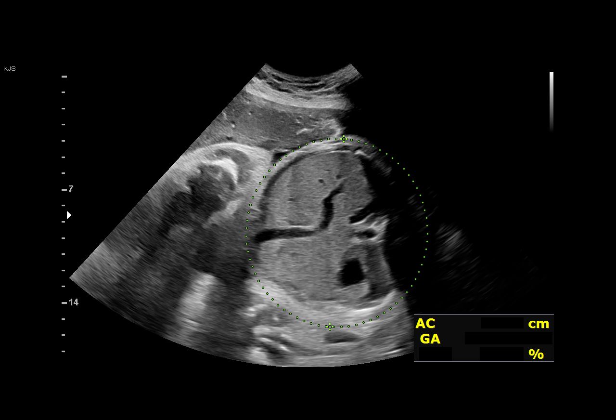
[im 17/36]
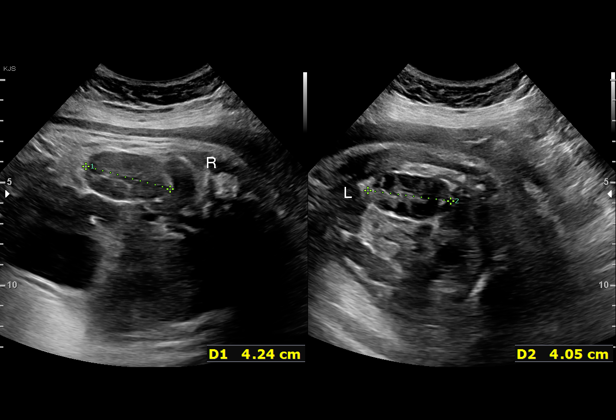
[im 20/36]
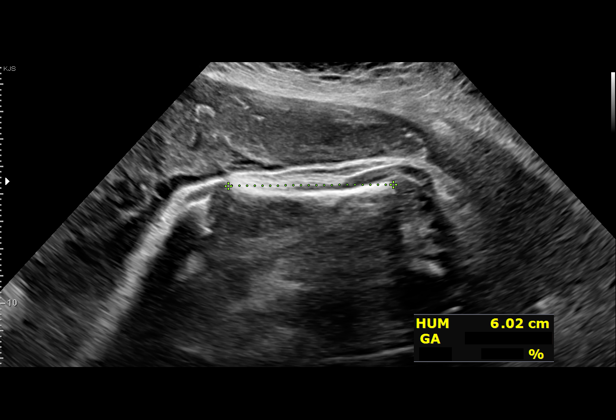
[im 23/36]
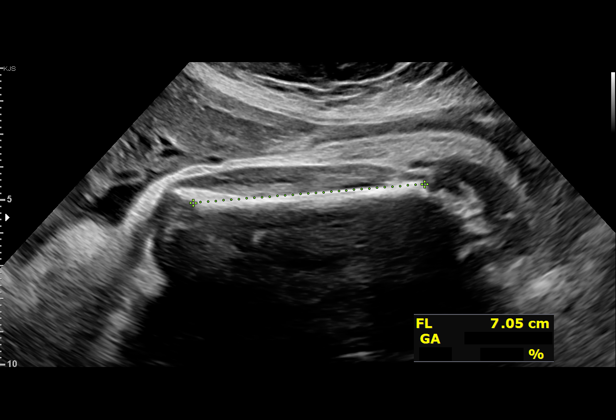
[im 25/36]
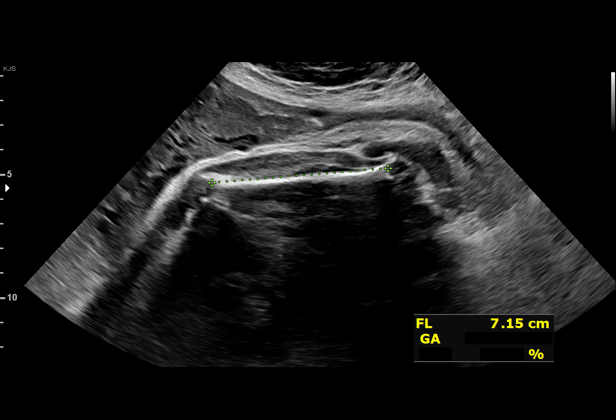
[im 28/36]
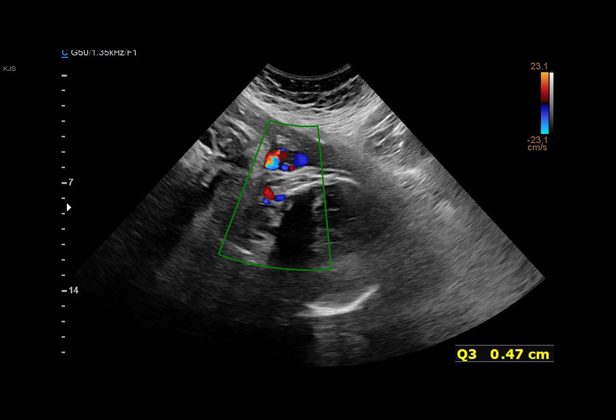
[im 30/36]
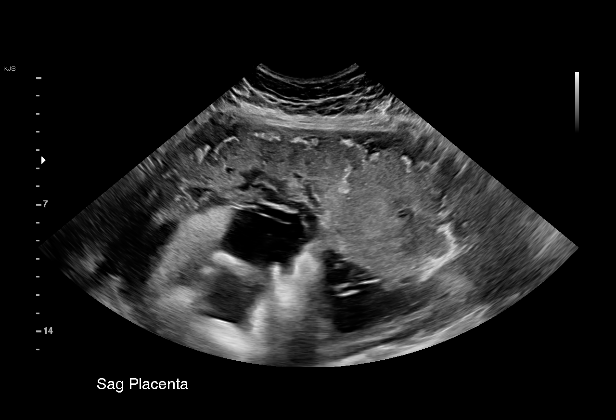
[im 33/36]
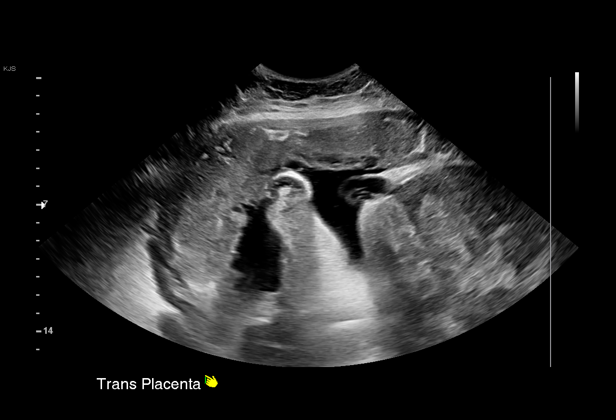
[im 36/36]
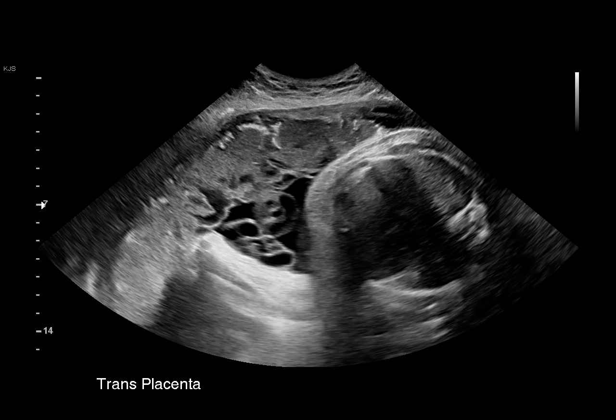

[14 of 28 positions shown; findings below may reference images not displayed]

Addendum:\.br----------------------------------------------------------------------
----------------------------------------------------------------------

----------------------------------------------------------------------

----------------------------------------------------------------------

    W/NONSTRESS
----------------------------------------------------------------------

----------------------------------------------------------------------
Indications

 Advanced maternal age multigravida 35+,
 third trimester
 Hypertension - Chronic/Pre-existing
 History of cesarean delivery, currently
 pregnant
 Obesity complicating pregnancy, third
 trimester
 Encounter for other antenatal screening
 follow-up
 38 weeks gestation of pregnancy
----------------------------------------------------------------------
Fetal Evaluation

 Num Of Fetuses:         1
 Fetal Heart Rate(bpm):  141
 Cardiac Activity:       Observed
 Presentation:           Cephalic
 Placenta:               Anterior
 P. Cord Insertion:      Previously Visualized

 Amniotic Fluid
 AFI FV:      Within normal limits

 AFI Sum(cm)     %Tile       Largest Pocket(cm)
 14.07           54

 RUQ(cm)       RLQ(cm)       LUQ(cm)        LLQ(cm)

----------------------------------------------------------------------
Biophysical Evaluation

 Amniotic F.V:   Pocket => 2 cm             F. Tone:        Observed
 F. Movement:    Observed                   Score:          [DATE]
 F. Breathing:   Observed
----------------------------------------------------------------------
Biometry

 BPD:      83.6  mm     G. Age:  33w 5d        < 1  %    CI:         68.7   %    70 - 86
                                                         FL/HC:      22.1   %    20.9 -
 HC:      322.3  mm     G. Age:  36w 3d        4.9  %    HC/AC:      0.90        0.92 -
 AC:      358.3  mm     G. Age:  39w 5d         95  %    FL/BPD:     85.2   %    71 - 87
 FL:       71.2  mm     G. Age:  36w 3d         15  %    FL/AC:      19.9   %    20 - 24
 HUM:      59.7  mm     G. Age:  34w 5d         10  %
 LV:        4.4  mm

 Est. FW:    3323  gm      7 lb 5 oz     56  %
----------------------------------------------------------------------
OB History

 Blood Type:   O+
 Gravidity:    3         Term:   2
----------------------------------------------------------------------
Gestational Age

 LMP:           37w 1d        Date:  02/13/20                 EDD:   11/19/20
 U/S Today:     36w 4d                                        EDD:   11/23/20
 Best:          38w 1d     Det. By:  Early Ultrasound         EDD:   11/12/20
                                     (04/17/20)
----------------------------------------------------------------------
Anatomy

 Cranium:               Appears normal         Stomach:                Appears normal, left
                                                                       sided
 Cavum:                 Appears normal         Kidneys:                Appear normal
 Ventricles:            Appears normal         Bladder:                Appears normal
 Diaphragm:             Appears normal
----------------------------------------------------------------------
Cervix Uterus Adnexa

 Cervix
 Not visualized (advanced GA >42wks)

 Uterus
 No abnormality visualized.
----------------------------------------------------------------------
Comments

 This patient was seen for a follow up growth scan due to
 advanced maternal age, obesity, and chronic hypertension.
 The patient reports feeling decreased fetal movements
 yesterday.  She is feeling fetal movements today.
 She was informed that the fetal growth and amniotic fluid
 level appears appropriate for her gestational age.
 A biophysical profile performed today was [DATE].
 Due to her underlying medical conditions, she is already
 scheduled for delivery on November 06, 2020 (next week).
 She has another biophysical profile scheduled in your office
 early next week.
 No further exams were scheduled in our office.
----------------------------------------------------------------------
----------------------------------------------------------------------

*** End of Addendum ***\.br----------------------------------------------------------------------
----------------------------------------------------------------------

----------------------------------------------------------------------

----------------------------------------------------------------------

----------------------------------------------------------------------

----------------------------------------------------------------------
Indications

 Advanced maternal age multigravida 35+,
 third trimester
 Hypertension - Chronic/Pre-existing
 History of cesarean delivery, currently
 pregnant
 Obesity complicating pregnancy, third
 trimester
 Encounter for other antenatal screening
 follow-up
 38 weeks gestation of pregnancy
----------------------------------------------------------------------
Fetal Evaluation

 Num Of Fetuses:         1
 Fetal Heart Rate(bpm):  141
 Cardiac Activity:       Observed
 Presentation:           Cephalic
 Placenta:               Anterior
 P. Cord Insertion:      Previously Visualized

 Amniotic Fluid
 AFI FV:      Within normal limits

 AFI Sum(cm)     %Tile       Largest Pocket(cm)
 14.07           54

 RUQ(cm)       RLQ(cm)       LUQ(cm)        LLQ(cm)

----------------------------------------------------------------------
Biophysical Evaluation
 Amniotic F.V:   Pocket => 2 cm             F. Tone:        Observed
 F. Movement:    Observed                   Score:          [DATE]
 F. Breathing:   Observed
----------------------------------------------------------------------
Biometry

 BPD:      83.6  mm     G. Age:  33w 5d        < 1  %    CI:         68.7   %    70 - 86
                                                         FL/HC:      22.1   %    20.9 -
 HC:      322.3  mm     G. Age:  36w 3d        4.9  %    HC/AC:      0.90        0.92 -
 AC:      358.3  mm     G. Age:  39w 5d         95  %    FL/BPD:     85.2   %    71 - 87
 FL:       71.2  mm     G. Age:  36w 3d         15  %    FL/AC:      19.9   %    20 - 24
 HUM:      59.7  mm     G. Age:  34w 5d         10  %
 LV:        4.4  mm

 Est. FW:    3323  gm      7 lb 5 oz     56  %
----------------------------------------------------------------------
OB History

 Blood Type:   O+
 Gravidity:    3         Term:   2
----------------------------------------------------------------------
Gestational Age

 LMP:           37w 1d        Date:  02/13/20                 EDD:   11/19/20
 U/S Today:     36w 4d                                        EDD:   11/23/20
 Best:          38w 1d     Det. By:  Early Ultrasound         EDD:   11/12/20
                                     (04/17/20)
----------------------------------------------------------------------
Anatomy

 Cranium:               Appears normal         Stomach:                Appears normal, left
                                                                       sided
 Cavum:                 Appears normal         Kidneys:                Appear normal
 Ventricles:            Appears normal         Bladder:                Appears normal
 Diaphragm:             Appears normal
----------------------------------------------------------------------
Cervix Uterus Adnexa

 Cervix
 Not visualized (advanced GA >42wks)

 Uterus
 No abnormality visualized.
----------------------------------------------------------------------
Comments

 This patient was seen for a follow up growth scan due to
 advanced maternal age, obesity, and chronic hypertension.
 The patient reports feeling decreased fetal movements
 yesterday.  She is feeling fetal movements today.
 She was informed that the fetal growth and amniotic fluid
 level appears appropriate for her gestational age.
 A biophysical profile performed today was [DATE].
 Due to her underlying medical conditions, she is already
 scheduled for delivery on November 06, 2020 (next week).
 She has another biophysical profile scheduled in your office
 early next week.
 No further exams were scheduled in our office.
----------------------------------------------------------------------
----------------------------------------------------------------------

## 2022-12-15 ENCOUNTER — Other Ambulatory Visit: Payer: Self-pay

## 2022-12-15 MED ORDER — NORGESTIMATE-ETH ESTRADIOL 0.25-35 MG-MCG PO TABS: 1.0000 | ORAL_TABLET | Freq: Every day | ORAL | 11 refills | Status: DC

## 2022-12-15 NOTE — Telephone Encounter (Signed)
Patient calls nurse line requesting refill on birth control. Advised that she is due for annual exam. Patient reports that she is currently unemployed and would not be able to afford appointment at this time.   She is requesting follow up appointment at later date. Scheduled appointment on 02/10/23. She is asking if she can receive refills until this appointment.   Please advise.   Veronda Prude, RN

## 2023-02-10 ENCOUNTER — Ambulatory Visit: Payer: Self-pay | Admitting: Family Medicine

## 2023-02-28 NOTE — Progress Notes (Unsigned)
    SUBJECTIVE:   CHIEF COMPLAINT / HPI:   On OCPs  PERTINENT  PMH / PSH: ***  OBJECTIVE:   There were no vitals taken for this visit. ***  General: NAD, pleasant, able to participate in exam Cardiac: RRR, no murmurs. Respiratory: CTAB, normal effort, No wheezes, rales or rhonchi Abdomen: Bowel sounds present, nontender, nondistended Extremities: no edema or cyanosis. Skin: warm and dry, no rashes noted Neuro: alert, no obvious focal deficits Psych: Normal affect and mood  ASSESSMENT/PLAN:   No problem-specific Assessment & Plan notes found for this encounter.     Dr. Elberta Fortis, DO [place] [facility]    {    This will disappear when note is signed, click to select method of visit    :1}

## 2023-03-01 ENCOUNTER — Ambulatory Visit: Payer: Self-pay | Admitting: Family Medicine

## 2023-03-01 ENCOUNTER — Encounter: Payer: Self-pay | Admitting: Family Medicine

## 2023-03-01 VITALS — BP 119/80 | HR 89 | Ht 61.0 in | Wt 211.0 lb

## 2023-03-01 DIAGNOSIS — F411 Generalized anxiety disorder: Secondary | ICD-10-CM

## 2023-03-01 DIAGNOSIS — Z0000 Encounter for general adult medical examination without abnormal findings: Secondary | ICD-10-CM

## 2023-03-01 DIAGNOSIS — L259 Unspecified contact dermatitis, unspecified cause: Secondary | ICD-10-CM

## 2023-03-01 DIAGNOSIS — E669 Obesity, unspecified: Secondary | ICD-10-CM

## 2023-03-01 LAB — POCT GLYCOSYLATED HEMOGLOBIN (HGB A1C): Hemoglobin A1C: 5.6 % (ref 4.0–5.6)

## 2023-03-01 MED ORDER — TRIAMCINOLONE ACETONIDE 0.025 % EX OINT: 1.0000 | TOPICAL_OINTMENT | Freq: Two times a day (BID) | CUTANEOUS | 0 refills | Status: AC

## 2023-03-01 NOTE — Assessment & Plan Note (Signed)
A1c 5.6, obtained due to strong family history of diabetes.  Recommend continued lifestyle interventions.

## 2023-03-01 NOTE — Patient Instructions (Signed)
It was wonderful to see you today! Thank you for choosing [facility].   Please bring ALL of your medications with you to every visit.   Today we talked about:  Please continue to take your oral birth control pills daily.  Remember if you miss a pill you can take it the next day but if you miss 2 days you have the possibility of becoming pregnant.  If that is the case please use backup contraception during that time.  I know you are considering a tubal ligation, if you would like referral to GYN to discuss further including cost please let me know. It sounds like you are managing her anxiety well on your own, this worsens or you feel you need medication management please follow back up with me. Your blood pressure looks good, you are not diabetic and I do not think any other blood work today.  He will next be due for a Pap smear in 2027.  Please follow up in 1 year  Call the clinic at [phone] if your symptoms worsen or you have any concerns.  Please be sure to schedule follow up at the front desk before you leave today.   Elberta Fortis, DO Family Medicine

## 2023-11-09 ENCOUNTER — Other Ambulatory Visit: Payer: Self-pay | Admitting: Family Medicine

## 2023-11-10 MED ORDER — NORGESTIMATE-ETH ESTRADIOL 0.25-35 MG-MCG PO TABS: 1.0000 | ORAL_TABLET | Freq: Every day | ORAL | 11 refills | Status: AC

## 2023-11-10 NOTE — Addendum Note (Signed)
 Addended by: HYLA RAISIN D on: 11/10/2023 11:03 AM   Modules accepted: Orders

## 2023-11-10 NOTE — Telephone Encounter (Signed)
 Rx printed instead of sent electronically.  Will resend now. Harlene Carte, CMA
